# Patient Record
Sex: Male | Born: 1965
Health system: Southern US, Community
[De-identification: ages and names within clinical notes are randomized; demographics above are authoritative.]

## PROBLEM LIST (undated history)

## (undated) DIAGNOSIS — E079 Disorder of thyroid, unspecified: Secondary | ICD-10-CM

## (undated) DIAGNOSIS — M5030 Other cervical disc degeneration, unspecified cervical region: Secondary | ICD-10-CM

## (undated) DIAGNOSIS — F209 Schizophrenia, unspecified: Secondary | ICD-10-CM

## (undated) DIAGNOSIS — Z9889 Other specified postprocedural states: Secondary | ICD-10-CM

## (undated) DIAGNOSIS — E785 Hyperlipidemia, unspecified: Secondary | ICD-10-CM

## (undated) DIAGNOSIS — R918 Other nonspecific abnormal finding of lung field: Secondary | ICD-10-CM

## (undated) DIAGNOSIS — M519 Unspecified thoracic, thoracolumbar and lumbosacral intervertebral disc disorder: Secondary | ICD-10-CM

## (undated) HISTORY — DX: Unspecified thoracic, thoracolumbar and lumbosacral intervertebral disc disorder: M51.9

## (undated) HISTORY — DX: Schizophrenia, unspecified: F20.9

## (undated) HISTORY — DX: Disorder of thyroid, unspecified: E07.9

## (undated) HISTORY — DX: Other cervical disc degeneration, unspecified cervical region: M50.30

## (undated) HISTORY — PX: BACK SURGERY: SHX140

## (undated) HISTORY — DX: Other nonspecific abnormal finding of lung field: R91.8

## (undated) HISTORY — DX: Hyperlipidemia, unspecified: E78.5

---

## 1990-08-22 DIAGNOSIS — F209 Schizophrenia, unspecified: Secondary | ICD-10-CM

## 1990-08-22 HISTORY — DX: Schizophrenia, unspecified: F20.9

## 2006-05-25 ENCOUNTER — Emergency Department (HOSPITAL_COMMUNITY): Admission: EM | Admit: 2006-05-25 | Discharge: 2006-05-25 | Payer: Self-pay | Admitting: Emergency Medicine

## 2008-04-08 ENCOUNTER — Emergency Department (HOSPITAL_COMMUNITY): Admission: EM | Admit: 2008-04-08 | Discharge: 2008-04-08 | Payer: Self-pay | Admitting: Emergency Medicine

## 2008-04-23 ENCOUNTER — Emergency Department (HOSPITAL_COMMUNITY): Admission: EM | Admit: 2008-04-23 | Discharge: 2008-04-23 | Payer: Self-pay | Admitting: Emergency Medicine

## 2008-05-01 ENCOUNTER — Ambulatory Visit (HOSPITAL_COMMUNITY): Admission: RE | Admit: 2008-05-01 | Discharge: 2008-05-02 | Payer: Self-pay | Admitting: Neurosurgery

## 2008-05-23 ENCOUNTER — Encounter: Admission: RE | Admit: 2008-05-23 | Discharge: 2008-05-23 | Payer: Self-pay | Admitting: Neurosurgery

## 2009-04-19 ENCOUNTER — Encounter: Admission: RE | Admit: 2009-04-19 | Discharge: 2009-04-19 | Payer: Self-pay | Admitting: Neurosurgery

## 2009-05-05 ENCOUNTER — Encounter (INDEPENDENT_AMBULATORY_CARE_PROVIDER_SITE_OTHER): Payer: Self-pay | Admitting: Neurosurgery

## 2009-05-05 ENCOUNTER — Ambulatory Visit: Payer: Self-pay | Admitting: Surgery

## 2009-05-05 ENCOUNTER — Ambulatory Visit (HOSPITAL_COMMUNITY): Admission: RE | Admit: 2009-05-05 | Discharge: 2009-05-05 | Payer: Self-pay | Admitting: Neurosurgery

## 2009-08-29 ENCOUNTER — Emergency Department: Payer: Self-pay | Admitting: Emergency Medicine

## 2009-11-21 ENCOUNTER — Emergency Department (HOSPITAL_BASED_OUTPATIENT_CLINIC_OR_DEPARTMENT_OTHER): Admission: EM | Admit: 2009-11-21 | Discharge: 2009-11-21 | Payer: Self-pay | Admitting: Emergency Medicine

## 2009-11-25 ENCOUNTER — Ambulatory Visit: Payer: Self-pay | Admitting: Radiology

## 2009-11-25 ENCOUNTER — Ambulatory Visit (HOSPITAL_BASED_OUTPATIENT_CLINIC_OR_DEPARTMENT_OTHER): Admission: RE | Admit: 2009-11-25 | Discharge: 2009-11-25 | Payer: Self-pay | Admitting: Emergency Medicine

## 2009-12-17 ENCOUNTER — Emergency Department (HOSPITAL_BASED_OUTPATIENT_CLINIC_OR_DEPARTMENT_OTHER): Admission: EM | Admit: 2009-12-17 | Discharge: 2009-12-17 | Payer: Self-pay | Admitting: Emergency Medicine

## 2011-01-04 NOTE — Op Note (Signed)
NAMESEDERICK, Julian Jacobs              ACCOUNT NO.:  [id number]   MEDICAL RECORD NO.:  [id number]          PATIENT TYPE:  INP   LOCATION:  3023                         FACILITY:  [facility]   PHYSICIAN:  Cristi Loron, M.D.DATE OF BIRTH:  1966/06/03   DATE OF PROCEDURE:  05/01/2008  DATE OF DISCHARGE:                               OPERATIVE REPORT   BRIEF HISTORY:  The patient is a 45 year old white male who suffered  from back and left leg pain consistent with left L5 radiculopathy.  He  has failed medical management, worked up with a lumbar MRI which  demonstrated a herniated disc at L4-5 on the left.  I discussed various  treatment options with the patient including surgery.  He has weighed  the risks, benefits, and alternatives of surgery and decided to proceed  with a left L4-5 diskectomy.   PREOPERATIVE DIAGNOSES:  1. Left L4-5 herniated nucleus pulposus.  2. Spinal stenosis.  3. Lumbar radiculopathy/myelopathy.  4. Lumbago.   POSTOPERATIVE DIAGNOSES:  1. Left L4-5 herniated nucleus pulposus.  2. spinal stenosis.  3. Lumbar radiculopathy/myelopathy.  4. Lumbago.   PROCEDURE:  Left L4-5 diskectomy using microdissection.   SURGEON:  Cristi Loron, MD   ASSISTANT:  Clydene Fake, MD   ANESTHESIA:  General endotracheal.   ESTIMATED BLOOD LOSS:  25 mL.   SPECIMENS:  None.   DRAINS:  None.   COMPLICATIONS:  None.   DESCRIPTION OF PROCEDURE:  The patient was brought to the operating room  by the anesthesia team.  General endotracheal anesthesia was induced.  The patient was turned to the prone position on Wilson frame.  His  lumbosacral region was then prepared with Betadine scrub and Betadine  solution.  Sterile drapes were applied.  I then injected the area to be  incised with Marcaine with epinephrine solution.  I used a scalpel to  make a linear midline incision over the L4-5 interspace.  I used  electrocautery to perform a left-sided subperiosteal  dissection exposing  the left spinous process lamina of L4 and L5.  We obtained  intraoperative radiograph to confirm our location.  I then inserted  McCullough retractor for exposure.  We then brought the operative  microscope into the field and under its magnification and illumination  we completed the microdissection/decompression.  I used a high-speed  drill to perform a left L4 laminotomy.  I widened the laminotomy with  Kerrison punch and removed the left L4-5 ligamentum flavum.  We then  performed a foraminotomy about the left L5 nerve root.  I then used  microdissection to free up the thecal sac and nerve root from the  epidural tissue.  Dr. Phoebe Perch gently retracted the neural structures  medially with a D'Errico retractor.  This exposed a free fragment disk  herniation which had migrated caudally from the L4-5 disk space and was  compressing the L5 nerve root just as entered the neural foramen.  We  removed this disk herniation and multiple fragments using pituitary  forceps.  I then inspected the intervertebral disk.  There was no large  holes in the  annulus nor was any compression from the disk space and did  not appear to be any impending herniations.  We, therefore, did not  enter into the intervertebral disk space.  I then palpated along the  ventral surface of the thecal sac along the exit route of the left L5  nerve root and noted the neural structures were well decompressed.  We  obtained hemostasis using bipolar cautery, irrigated the wound out with  bacitracin solution, removed the retractors and then reapproximated the  patient's thoracolumbar fascia with interrupted #1 Vicryl suture,  subcutaneous tissue with interrupted 2-0 Vicryl suture, and the skin  with Steri-Strips and Benzoin.  The wound was then coated with  bacitracin ointment.  Sterile dressing was applied.  The drapes were  removed.  The patient was subsequently returned to supine position where  he was  extubated by anesthesia team and transported to the post  anesthesia care unit in stable condition.  All sponge, instrument, and  needle counts were correct at the end of the case.      Cristi Loron, M.D.  Electronically Signed     JDJ/MEDQ  D:  05/01/2008  T:  05/02/2008  Job:  [id number]

## 2011-05-25 LAB — BASIC METABOLIC PANEL
CO2: 29
GFR calc non Af Amer: >60
Sodium: 138

## 2011-05-25 LAB — CBC
MCHC: 34.8
RBC: 4.67
WBC: 12.4 — ABNORMAL HIGH

## 2015-08-23 DIAGNOSIS — R918 Other nonspecific abnormal finding of lung field: Secondary | ICD-10-CM

## 2015-08-23 HISTORY — DX: Other nonspecific abnormal finding of lung field: R91.8

## 2015-08-23 HISTORY — PX: NECK SURGERY: SHX720

## 2016-03-01 ENCOUNTER — Encounter (HOSPITAL_COMMUNITY): Payer: Self-pay | Admitting: *Deleted

## 2016-03-01 ENCOUNTER — Emergency Department (HOSPITAL_COMMUNITY)
Admission: EM | Admit: 2016-03-01 | Discharge: 2016-03-02 | Disposition: A | Discharge status: Home / Self Care | Payer: Medicare Other | Attending: Emergency Medicine | Admitting: Emergency Medicine

## 2016-03-01 DIAGNOSIS — M545 Low back pain, unspecified: Secondary | ICD-10-CM

## 2016-03-01 DIAGNOSIS — F17200 Nicotine dependence, unspecified, uncomplicated: Secondary | ICD-10-CM | POA: Insufficient documentation

## 2016-03-01 HISTORY — DX: Other specified postprocedural states: Z98.890

## 2016-03-01 MED ORDER — HYDROMORPHONE HCL 1 MG/ML IJ SOLN
1.0000 mg | Freq: Once | INTRAMUSCULAR | Status: AC
  Administered 2016-03-01: 1 mg via INTRAMUSCULAR
  Filled 2016-03-01: qty 1

## 2016-03-01 MED ORDER — ONDANSETRON 4 MG PO TBDP
8.0000 mg | ORAL_TABLET | Freq: Once | ORAL | Status: AC
  Administered 2016-03-01: 8 mg via ORAL
  Filled 2016-03-01: qty 2

## 2016-03-01 MED ORDER — OXYCODONE-ACETAMINOPHEN 5-325 MG PO TABS
ORAL_TABLET | ORAL | Status: AC
  Filled 2016-03-01: qty 1

## 2016-03-01 MED ORDER — METHOCARBAMOL 500 MG PO TABS
1000.0000 mg | ORAL_TABLET | Freq: Once | ORAL | Status: AC
  Administered 2016-03-01: 1000 mg via ORAL
  Filled 2016-03-01: qty 2

## 2016-03-01 MED ORDER — KETOROLAC TROMETHAMINE 60 MG/2ML IM SOLN
60.0000 mg | Freq: Once | INTRAMUSCULAR | Status: AC
  Administered 2016-03-01: 60 mg via INTRAMUSCULAR
  Filled 2016-03-01: qty 2

## 2016-03-01 MED ORDER — OXYCODONE-ACETAMINOPHEN 5-325 MG PO TABS
1.0000 | ORAL_TABLET | ORAL | Status: DC | PRN
  Administered 2016-03-01: 1 via ORAL

## 2016-03-01 NOTE — ED Notes (Signed)
Pt states about a hour ago pt was pulling weeds then felt a sudden pop in his lower back. Pt has hx of back issues and surgery. Dr. Lovell SheehanJenkins performed back surgery on pt in 2009. Pt denies any loose of bowel or bladder.

## 2016-03-01 NOTE — ED Provider Notes (Signed)
CSN: [id number]     Arrival date & time 03/01/16  1943 History  By signing my name below, I, Julian Jacobs, attest that this documentation has been prepared under the direction and in the presence of [name], PA-C Electronically Signed: Soijett Jacobs, ED Scribe. 03/01/2016. 11:28 PM.  Chief Complaint  Patient presents with  . Back Pain      The history is provided by the patient. No language interpreter was used.    HPI Comments: Julian Jacobs is a 50 y.o. male who presents to the Emergency Department complaining of lower back pain, right > left onset 4 hours ago. Pt states that he was doing yard work attempting to pull a root from the ground when he felt sudden a pop to his lower back. Pt reports that he had to have a partial laminectomy back surgery in 2009 by Dr. Lovell Sheehan due to a ruptured L4 and L5. Pt notes that his lower back pain radiates to his lower abdomen and into bilateral anterior thighs, right > left. He states that he is having associated symptoms of gait problem due to pain, abdominal pain, and bilateral anterior thigh pain. He states that he has not tried any medications for the relief for his symptoms. Pt denies n/v, urinary symptoms, numbness, saddle paresthesia, difficulty urinating, bowel/bladder incontinence, and any other symptoms. Denies PMHx of CA or IV drug use.    Past Medical History  Diagnosis Date  . Previous back surgery    Past Surgical History  Procedure Laterality Date  . Back surgery     History reviewed. No pertinent family history. Social History  Substance Use Topics  . Smoking status: Current Every Day Smoker  . Smokeless tobacco: Never Used  . Alcohol Use: No    Review of Systems  Gastrointestinal: Positive for abdominal pain. Negative for nausea and vomiting.       No bowel incontinence.   Genitourinary: Negative for difficulty urinating.       No bladder incontinence.   Musculoskeletal: Positive for back pain and arthralgias.   Skin: Negative for color change, rash and wound.  Neurological: Negative for numbness.      Allergies  Penicillins  Home Medications   Prior to Admission medications   Not on File   BP 137/94 mmHg  Pulse 73  Temp(Src) 97.9 F (36.6 C) (Oral)  Resp 22  SpO2 99%   Physical Exam  Constitutional: He is oriented to person, place, and time. He appears well-developed and well-nourished. He appears distressed.  Appears in pain  HENT:  Head: Normocephalic and atraumatic.  Eyes: EOM are normal.  Neck: Neck supple.  Cardiovascular: Normal rate.   Pulmonary/Chest: Effort normal. No respiratory distress.  Abdominal: Soft. He exhibits no distension and no mass. There is no tenderness. There is no rebound and no guarding.  Musculoskeletal: Normal range of motion.  Inspection: No masses, deformity, or rash. Well healed surgical scar over lower back. Palpation: Tenderness from ~L3-S1. No point tenderness. Right lumbar paraspinal muscle tenderness. ROM: Deferred Strength: 2/5 in left lower extremity and 1/5 in right lower extremity Sensation: Intact sensation with light touch in lower extremities bilaterally Gait: Antalgic Reflexes: Patellar reflex is 2+ bilaterally, Achilles is 2+ bilaterally SLR: positive bilateral seated straight leg raise  Neurological: He is alert and oriented to person, place, and time.  Skin: Skin is warm and dry.  Psychiatric: He has a normal mood and affect. His behavior is normal.  Nursing note and vitals reviewed.  ED Course  Procedures (including critical care time) DIAGNOSTIC STUDIES: Oxygen Saturation is 99% on RA, nl by my interpretation.    COORDINATION OF CARE: 11:28 PM Discussed treatment plan with pt at bedside and pt agreed to plan.   Meds given in ED:  Medications  HYDROmorphone (DILAUDID) injection 1 mg (1 mg Intramuscular Given 03/01/16 2343)  methocarbamol (ROBAXIN) tablet 1,000 mg (1,000 mg Oral Given 03/01/16 2340)  ondansetron  (ZOFRAN-ODT) disintegrating tablet 8 mg (8 mg Oral Given 03/01/16 2341)  ketorolac (TORADOL) injection 60 mg (60 mg Intramuscular Given 03/01/16 2342)    Discharge Medication List as of 03/02/2016 12:12 AM    START taking these medications   Details  cyclobenzaprine (FLEXERIL) 10 MG tablet Take 1 tablet (10 mg total) by mouth at bedtime., Starting 03/02/2016, Until Discontinued, Print    ibuprofen (ADVIL,MOTRIN) 600 MG tablet Take 1 tablet (600 mg total) by mouth 3 (three) times daily., Starting 03/02/2016, Until Discontinued, Print    ondansetron (ZOFRAN) 4 MG tablet Take 1 tablet (4 mg total) by mouth every 6 (six) hours., Starting 03/02/2016, Until Discontinued, Print    oxyCODONE-acetaminophen (PERCOCET/ROXICET) 5-325 MG tablet Take 2 tablets by mouth every 4 (four) hours as needed for severe pain., Starting 03/02/2016, Until Discontinued, Print          MDM   Final diagnoses:  Midline low back pain without sciatica   50 year old male who presents with acute on chronic back pain. It's possible he has herniated another disc or has a prolapsed disc due to his history. No red flag back pain signs/symptoms and injury was atraumatic therefore will not obtain imaging at this time. He is in a significant amount of pain. Will treat aggressively with Dilaudid, Toradol, and Robaxin. Zofran given per patient request. On recheck patient reports significant relief. Discussed he will likely need a outpatient MRI and urged him to follow up with Dr. Lovell SheehanJenkins. Patient verbalized understanding. Patient is NAD, non-toxic, with stable VS. Patient is informed of clinical course, understands medical decision making process, and agrees with plan. Opportunity for questions provided and all questions answered. Return precautions given.  I personally performed the services described in this documentation, which was scribed in my presence. The recorded information has been reviewed and is accurate.    Bethel BornKelly Marie  Megham Dwyer, PA-C 03/03/16 1425  Marily MemosJason Mesner, MD 03/04/16 [phone]

## 2016-03-02 MED ORDER — IBUPROFEN 600 MG PO TABS: 600.0000 mg | ORAL_TABLET | Freq: Three times a day (TID) | ORAL | Status: DC

## 2016-03-02 MED ORDER — ONDANSETRON HCL 4 MG PO TABS: 4.0000 mg | ORAL_TABLET | Freq: Four times a day (QID) | ORAL | Status: DC

## 2016-03-02 MED ORDER — OXYCODONE-ACETAMINOPHEN 5-325 MG PO TABS: 2.0000 | ORAL_TABLET | ORAL | Status: DC | PRN

## 2016-03-02 MED ORDER — CYCLOBENZAPRINE HCL 10 MG PO TABS: 10.0000 mg | ORAL_TABLET | Freq: Every day | ORAL | Status: DC

## 2016-03-02 NOTE — Discharge Instructions (Signed)

## 2017-03-02 DIAGNOSIS — R918 Other nonspecific abnormal finding of lung field: Secondary | ICD-10-CM | POA: Insufficient documentation

## 2017-03-02 DIAGNOSIS — K5732 Diverticulitis of large intestine without perforation or abscess without bleeding: Secondary | ICD-10-CM | POA: Insufficient documentation

## 2017-03-30 DIAGNOSIS — Z720 Tobacco use: Secondary | ICD-10-CM | POA: Insufficient documentation

## 2017-03-30 DIAGNOSIS — R6881 Early satiety: Secondary | ICD-10-CM | POA: Insufficient documentation

## 2017-03-30 DIAGNOSIS — M5412 Radiculopathy, cervical region: Secondary | ICD-10-CM | POA: Insufficient documentation

## 2017-04-17 DIAGNOSIS — R634 Abnormal weight loss: Secondary | ICD-10-CM | POA: Insufficient documentation

## 2017-05-16 DIAGNOSIS — K219 Gastro-esophageal reflux disease without esophagitis: Secondary | ICD-10-CM | POA: Insufficient documentation

## 2018-03-26 ENCOUNTER — Encounter (HOSPITAL_COMMUNITY): Payer: Self-pay | Admitting: Emergency Medicine

## 2018-03-26 ENCOUNTER — Emergency Department (HOSPITAL_COMMUNITY): Payer: Medicare HMO

## 2018-03-26 ENCOUNTER — Emergency Department (HOSPITAL_COMMUNITY)
Admission: EM | Admit: 2018-03-26 | Discharge: 2018-03-26 | Disposition: A | Discharge status: Home / Self Care | Payer: Medicare HMO | Attending: Emergency Medicine | Admitting: Emergency Medicine

## 2018-03-26 ENCOUNTER — Other Ambulatory Visit: Payer: Self-pay

## 2018-03-26 DIAGNOSIS — S199XXA Unspecified injury of neck, initial encounter: Secondary | ICD-10-CM | POA: Diagnosis not present

## 2018-03-26 DIAGNOSIS — S4991XA Unspecified injury of right shoulder and upper arm, initial encounter: Secondary | ICD-10-CM | POA: Diagnosis not present

## 2018-03-26 DIAGNOSIS — Z79899 Other long term (current) drug therapy: Secondary | ICD-10-CM | POA: Diagnosis not present

## 2018-03-26 DIAGNOSIS — M5412 Radiculopathy, cervical region: Secondary | ICD-10-CM | POA: Insufficient documentation

## 2018-03-26 DIAGNOSIS — M542 Cervicalgia: Secondary | ICD-10-CM | POA: Diagnosis not present

## 2018-03-26 DIAGNOSIS — F17210 Nicotine dependence, cigarettes, uncomplicated: Secondary | ICD-10-CM | POA: Insufficient documentation

## 2018-03-26 DIAGNOSIS — M25511 Pain in right shoulder: Secondary | ICD-10-CM | POA: Diagnosis not present

## 2018-03-26 MED ORDER — HYDROCODONE-ACETAMINOPHEN 5-325 MG PO TABS
1.0000 | ORAL_TABLET | Freq: Once | ORAL | Status: AC
  Administered 2018-03-26: 1 via ORAL
  Filled 2018-03-26: qty 1

## 2018-03-26 MED ORDER — ONDANSETRON 4 MG PO TBDP
4.0000 mg | ORAL_TABLET | Freq: Once | ORAL | Status: AC
Start: 2018-03-26 — End: 2018-03-26
  Administered 2018-03-26: 4 mg via ORAL
  Filled 2018-03-26: qty 1

## 2018-03-26 MED ORDER — ONDANSETRON HCL 4 MG PO TABS: 4.0000 mg | ORAL_TABLET | Freq: Four times a day (QID) | ORAL | 0 refills | Status: AC

## 2018-03-26 MED ORDER — ACETAMINOPHEN 325 MG PO TABS: 650.0000 mg | ORAL_TABLET | Freq: Four times a day (QID) | ORAL | 0 refills | Status: AC | PRN

## 2018-03-26 MED ORDER — HYDROCODONE-ACETAMINOPHEN 5-325 MG PO TABS: 2.0000 | ORAL_TABLET | ORAL | 0 refills | Status: AC | PRN
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
## 2018-03-26 NOTE — ED Notes (Signed)
Declined W/C at D/C and was escorted to lobby by RN. 
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
## 2018-03-26 NOTE — Discharge Instructions (Addendum)
Please return to the ED if you experience any of the following symptoms:  Your pain gets much worse and cannot be controlled with medicines. You have weakness or numbness in your hand, arm, face, or leg. You have a high fever. You have a stiff, rigid neck. You lose control of your bowels or your bladder (have incontinence). You have trouble with walking, balance, or speaking.

## 2018-04-17 ENCOUNTER — Emergency Department (HOSPITAL_COMMUNITY): Payer: Medicare HMO

## 2018-04-17 ENCOUNTER — Emergency Department (HOSPITAL_COMMUNITY)
Admission: EM | Admit: 2018-04-17 | Discharge: 2018-04-17 | Disposition: A | Discharge status: Home / Self Care | Payer: Medicare HMO | Attending: Emergency Medicine | Admitting: Emergency Medicine

## 2018-04-17 DIAGNOSIS — Z87891 Personal history of nicotine dependence: Secondary | ICD-10-CM | POA: Insufficient documentation

## 2018-04-17 DIAGNOSIS — R0789 Other chest pain: Secondary | ICD-10-CM | POA: Diagnosis present

## 2018-04-17 DIAGNOSIS — R001 Bradycardia, unspecified: Secondary | ICD-10-CM | POA: Diagnosis not present

## 2018-04-17 DIAGNOSIS — J181 Lobar pneumonia, unspecified organism: Secondary | ICD-10-CM | POA: Diagnosis not present

## 2018-04-17 DIAGNOSIS — J189 Pneumonia, unspecified organism: Secondary | ICD-10-CM

## 2018-04-17 DIAGNOSIS — Z79899 Other long term (current) drug therapy: Secondary | ICD-10-CM | POA: Diagnosis not present

## 2018-04-17 DIAGNOSIS — R079 Chest pain, unspecified: Secondary | ICD-10-CM | POA: Diagnosis not present

## 2018-04-17 LAB — CBC
HCT: 48.1 % (ref 39.0–52.0)
Hemoglobin: 16.1 g/dL (ref 13.0–17.0)
MCH: 32 pg (ref 26.0–34.0)
MCHC: 33.5 g/dL (ref 30.0–36.0)
MCV: 95.6 fL (ref 78.0–100.0)
PLATELETS: 251 10*3/uL (ref 150–400)
RBC: 5.03 MIL/uL (ref 4.22–5.81)
RDW: 12.6 % (ref 11.5–15.5)
WBC: 9.2 10*3/uL (ref 4.0–10.5)

## 2018-04-17 LAB — I-STAT TROPONIN, ED
TROPONIN I, POC: 0 ng/mL (ref 0.00–0.08)
Troponin i, poc: 0 ng/mL (ref 0.00–0.08)

## 2018-04-17 LAB — BASIC METABOLIC PANEL WITH GFR
Anion gap: 10 (ref 5–15)
BUN: 7 mg/dL (ref 6–20)
CO2: 27 mmol/L (ref 22–32)
Calcium: 9.8 mg/dL (ref 8.9–10.3)
Chloride: 103 mmol/L (ref 98–111)
Creatinine, Ser: 0.89 mg/dL (ref 0.61–1.24)
GFR calc Af Amer: >60 mL/min
GFR calc non Af Amer: >60 mL/min
Glucose, Bld: 82 mg/dL (ref 70–99)
Potassium: 4 mmol/L (ref 3.5–5.1)
Sodium: 140 mmol/L (ref 135–145)

## 2018-04-17 MED ORDER — ALBUTEROL SULFATE HFA 108 (90 BASE) MCG/ACT IN AERS: 2.0000 | INHALATION_SPRAY | Freq: Four times a day (QID) | RESPIRATORY_TRACT | 2 refills | Status: DC | PRN

## 2018-04-17 MED ORDER — LEVOFLOXACIN 500 MG PO TABS: 750.0000 mg | ORAL_TABLET | Freq: Every day | ORAL | 0 refills | Status: AC

## 2018-04-17 MED ORDER — IPRATROPIUM-ALBUTEROL 0.5-2.5 (3) MG/3ML IN SOLN
3.0000 mL | Freq: Once | RESPIRATORY_TRACT | Status: AC
  Administered 2018-04-17: 3 mL via RESPIRATORY_TRACT
  Filled 2018-04-17: qty 3

## 2018-04-17 NOTE — Discharge Instructions (Addendum)
You were seen in the ER for chest pain, cough, shortness of breath.  Chest x-ray shows pneumonia in the left lung.  Take antibiotic as prescribed.  Use albuterol inhaler every 4-6 hours over the next 2 to 3 days to help with breathing.  Return to the ER for worsening symptoms, fevers, chills, chest pain or shortness of breath on exertion, leg swelling or calf pain.  Follow up with Dr. Lovell SheehanJenkins in regards to chronic neck pain. Return to the ER for loss of sensation or heaviness to your right arm

## 2018-04-17 NOTE — ED Notes (Signed)
Patient transported to X-ray 

## 2018-04-17 NOTE — ED Notes (Signed)
ED Provider at bedside. 

## 2018-04-17 NOTE — ED Notes (Signed)
Pt ambulated hall with this RN. O2 saturation maintained at 95% or higher on room air
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
## 2018-05-22 ENCOUNTER — Other Ambulatory Visit: Payer: Self-pay | Admitting: Internal Medicine

## 2018-05-22 ENCOUNTER — Encounter: Payer: Self-pay | Admitting: Internal Medicine

## 2018-05-22 ENCOUNTER — Ambulatory Visit (INDEPENDENT_AMBULATORY_CARE_PROVIDER_SITE_OTHER): Payer: Medicare HMO | Admitting: Internal Medicine

## 2018-05-22 VITALS — BP 118/78 | HR 69 | Temp 98.1°F | Ht 67.0 in | Wt 167.2 lb

## 2018-05-22 DIAGNOSIS — R918 Other nonspecific abnormal finding of lung field: Secondary | ICD-10-CM | POA: Diagnosis not present

## 2018-05-22 DIAGNOSIS — Z79899 Other long term (current) drug therapy: Secondary | ICD-10-CM

## 2018-05-22 DIAGNOSIS — J441 Chronic obstructive pulmonary disease with (acute) exacerbation: Secondary | ICD-10-CM

## 2018-05-22 DIAGNOSIS — F209 Schizophrenia, unspecified: Secondary | ICD-10-CM | POA: Diagnosis not present

## 2018-05-22 DIAGNOSIS — R7989 Other specified abnormal findings of blood chemistry: Secondary | ICD-10-CM

## 2018-05-22 DIAGNOSIS — R062 Wheezing: Secondary | ICD-10-CM | POA: Diagnosis not present

## 2018-05-22 DIAGNOSIS — E049 Nontoxic goiter, unspecified: Secondary | ICD-10-CM

## 2018-05-22 DIAGNOSIS — J189 Pneumonia, unspecified organism: Secondary | ICD-10-CM

## 2018-05-22 MED ORDER — ALBUTEROL SULFATE HFA 108 (90 BASE) MCG/ACT IN AERS: 2.0000 | INHALATION_SPRAY | Freq: Four times a day (QID) | RESPIRATORY_TRACT | 2 refills | Status: DC | PRN

## 2018-05-22 MED ORDER — PANTOPRAZOLE SODIUM 40 MG PO TBEC: 40.0000 mg | DELAYED_RELEASE_TABLET | Freq: Every day | ORAL | 0 refills | Status: DC

## 2018-05-22 MED ORDER — GABAPENTIN 600 MG PO TABS: 600.0000 mg | ORAL_TABLET | Freq: Three times a day (TID) | ORAL | 0 refills | Status: DC
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
## 2018-05-23 DIAGNOSIS — J441 Chronic obstructive pulmonary disease with (acute) exacerbation: Secondary | ICD-10-CM | POA: Diagnosis not present

## 2018-05-23 LAB — COMPREHENSIVE METABOLIC PANEL
ALT: 14 IU/L (ref 0–44)
AST: 15 IU/L (ref 0–40)
Albumin/Globulin Ratio: 1.6 (ref 1.2–2.2)
Albumin: 4.7 g/dL (ref 3.5–5.5)
Alkaline Phosphatase: 118 IU/L — ABNORMAL HIGH (ref 39–117)
BUN/Creatinine Ratio: 8 — ABNORMAL LOW (ref 9–20)
BUN: 8 mg/dL (ref 6–24)
Bilirubin Total: 0.3 mg/dL (ref 0.0–1.2)
CALCIUM: 10 mg/dL (ref 8.7–10.2)
CO2: 24 mmol/L (ref 20–29)
CREATININE: 1.01 mg/dL (ref 0.76–1.27)
Chloride: 100 mmol/L (ref 96–106)
GFR calc Af Amer: 97 mL/min/{1.73_m2} (ref >59)
GFR, EST NON AFRICAN AMERICAN: 84 mL/min/{1.73_m2} (ref >59)
GLOBULIN, TOTAL: 2.9 g/dL (ref 1.5–4.5)
Glucose: 92 mg/dL (ref 65–99)
Potassium: 4.7 mmol/L (ref 3.5–5.2)
Sodium: 139 mmol/L (ref 134–144)
Total Protein: 7.6 g/dL (ref 6.0–8.5)

## 2018-05-23 LAB — T3: T3 TOTAL: 149 ng/dL (ref 71–180)

## 2018-05-23 LAB — T4: T4 TOTAL: 7.6 ug/dL (ref 4.5–12.0)

## 2018-05-23 LAB — TSH: TSH: 4.91 u[IU]/mL — ABNORMAL HIGH (ref 0.450–4.500)

## 2018-05-24 ENCOUNTER — Other Ambulatory Visit: Payer: Self-pay | Admitting: Internal Medicine

## 2018-05-24 ENCOUNTER — Other Ambulatory Visit: Payer: Self-pay

## 2018-05-24 ENCOUNTER — Telehealth: Payer: Self-pay | Admitting: Internal Medicine

## 2018-05-24 DIAGNOSIS — J441 Chronic obstructive pulmonary disease with (acute) exacerbation: Secondary | ICD-10-CM | POA: Diagnosis not present

## 2018-05-24 MED ORDER — ALBUTEROL SULFATE (2.5 MG/3ML) 0.083% IN NEBU: 2.5000 mg | INHALATION_SOLUTION | Freq: Four times a day (QID) | RESPIRATORY_TRACT | 12 refills | Status: DC | PRN

## 2018-05-24 MED ORDER — ALBUTEROL SULFATE (2.5 MG/3ML) 0.083% IN NEBU: 2.5000 mg | INHALATION_SOLUTION | Freq: Four times a day (QID) | RESPIRATORY_TRACT | 12 refills | Status: AC | PRN

## 2018-05-24 MED ORDER — LORATADINE 10 MG PO TABS: 10.0000 mg | ORAL_TABLET | Freq: Every day | ORAL | 2 refills | Status: AC

## 2018-05-25 ENCOUNTER — Other Ambulatory Visit: Payer: Self-pay | Admitting: Internal Medicine

## 2018-05-25 DIAGNOSIS — E039 Hypothyroidism, unspecified: Secondary | ICD-10-CM

## 2018-05-25 MED ORDER — LEVOTHYROXINE SODIUM 50 MCG PO TABS: 50.0000 ug | ORAL_TABLET | Freq: Every day | ORAL | 1 refills | Status: DC

## 2018-05-25 NOTE — Telephone Encounter (Signed)
It was called in

## 2018-05-31 ENCOUNTER — Ambulatory Visit (INDEPENDENT_AMBULATORY_CARE_PROVIDER_SITE_OTHER): Payer: Medicare HMO | Admitting: Internal Medicine

## 2018-05-31 ENCOUNTER — Encounter: Payer: Self-pay | Admitting: Internal Medicine

## 2018-05-31 VITALS — BP 112/70 | HR 70 | Temp 98.0°F | Ht 67.0 in | Wt 171.4 lb

## 2018-05-31 DIAGNOSIS — J449 Chronic obstructive pulmonary disease, unspecified: Secondary | ICD-10-CM

## 2018-05-31 DIAGNOSIS — R945 Abnormal results of liver function studies: Secondary | ICD-10-CM

## 2018-05-31 DIAGNOSIS — Z125 Encounter for screening for malignant neoplasm of prostate: Secondary | ICD-10-CM

## 2018-05-31 DIAGNOSIS — R918 Other nonspecific abnormal finding of lung field: Secondary | ICD-10-CM | POA: Insufficient documentation

## 2018-05-31 DIAGNOSIS — N139 Obstructive and reflux uropathy, unspecified: Secondary | ICD-10-CM

## 2018-05-31 DIAGNOSIS — E039 Hypothyroidism, unspecified: Secondary | ICD-10-CM | POA: Insufficient documentation

## 2018-05-31 DIAGNOSIS — F209 Schizophrenia, unspecified: Secondary | ICD-10-CM | POA: Diagnosis not present

## 2018-05-31 DIAGNOSIS — Z720 Tobacco use: Secondary | ICD-10-CM | POA: Diagnosis not present

## 2018-05-31 DIAGNOSIS — Z0000 Encounter for general adult medical examination without abnormal findings: Secondary | ICD-10-CM | POA: Diagnosis not present

## 2018-05-31 DIAGNOSIS — R5383 Other fatigue: Secondary | ICD-10-CM

## 2018-05-31 DIAGNOSIS — E559 Vitamin D deficiency, unspecified: Secondary | ICD-10-CM | POA: Diagnosis not present

## 2018-05-31 DIAGNOSIS — J439 Emphysema, unspecified: Secondary | ICD-10-CM | POA: Insufficient documentation

## 2018-05-31 DIAGNOSIS — Z1211 Encounter for screening for malignant neoplasm of colon: Secondary | ICD-10-CM

## 2018-05-31 LAB — POCT URINALYSIS DIPSTICK
Bilirubin, UA: NEGATIVE
Glucose, UA: NEGATIVE
Ketones, UA: NEGATIVE
LEUKOCYTES UA: NEGATIVE
Nitrite, UA: NEGATIVE
PH UA: 5.5 (ref 5.0–8.0)
Protein, UA: NEGATIVE
RBC UA: NEGATIVE
SPEC GRAV UA: 1.025 (ref 1.010–1.025)
UROBILINOGEN UA: 0.2 U/dL

## 2018-05-31 MED ORDER — BUDESONIDE-FORMOTEROL FUMARATE 160-4.5 MCG/ACT IN AERO: INHALATION_SPRAY | RESPIRATORY_TRACT | 2 refills | Status: AC

## 2018-05-31 NOTE — Progress Notes (Addendum)
Subjective:     Patient ID: Julian Jacobs , male    DOB: 09-20-1965 , 52 y.o.   MRN: [id number]   HPI  Here for complete physical. Has apt with mental health 11/13. Colonoscopy 2017- was told to repeat in 5 years. Thinks it was negative.  Has not heard from pulmonologist. The Duo nebs have helped him breath better, but still wheezing and getting SOB. He continues working on quit smoking.  Had a pet scant 2 years ago when the CXR showed lung nodules and was told is not suspicious.     Past Medical History:  Diagnosis Date  . Lung nodules 2017  . Previous back surgery   . Schizophrenia (HCC) 1992    Allergies  Allergen Reactions  . Penicillins Anaphylaxis    Has patient had a PCN reaction causing immediate rash, facial/tongue/throat swelling, SOB or lightheadedness with hypotension: Yes Has patient had a PCN reaction causing severe rash involving mucus membranes or skin necrosis: No Has patient had a PCN reaction that required hospitalization: No Has patient had a PCN reaction occurring within the last 10 years: No If all of the above answers are "NO", then may proceed with Cephalosporin use.     Current Outpatient Medications:  .  albuterol (PROVENTIL HFA;VENTOLIN HFA) 108 (90 Base) MCG/ACT inhaler, Inhale 2 puffs into the lungs every 6 (six) hours as needed for wheezing or shortness of breath., Disp: 1 Inhaler, Rfl: 2 .  albuterol (PROVENTIL) (2.5 MG/3ML) 0.083% nebulizer solution, Take 3 mLs (2.5 mg total) by nebulization every 6 (six) hours as needed for wheezing or shortness of breath (to use one ampule into neb machine q 4-6h prn wheezing and cough)., Disp: 75 mL, Rfl: 12 .  cyclobenzaprine (FLEXERIL) 10 MG tablet, Take 1 tablet (10 mg total) by mouth at bedtime., Disp: 10 tablet, Rfl: 0 .  gabapentin (NEURONTIN) 600 MG tablet, Take 1 tablet (600 mg total) by mouth 3 (three) times daily for 90 doses., Disp: 90 tablet, Rfl: 0 .  ibuprofen (ADVIL,MOTRIN) 600 MG tablet, Take 1  tablet (600 mg total) by mouth 3 (three) times daily., Disp: 15 tablet, Rfl: 0 .  ibuprofen (ADVIL,MOTRIN) 800 MG tablet, Take 800 mg by mouth 2 (two) times daily., Disp: , Rfl: 0 .  levofloxacin (LEVAQUIN) 500 MG tablet, Take 500 mg by mouth daily., Disp: , Rfl:  .  levothyroxine (SYNTHROID) 50 MCG tablet, Take 1 tablet (50 mcg total) by mouth daily. 30 minutes before meals, Disp: 30 tablet, Rfl: 1 .  loratadine (CLARITIN) 10 MG tablet, Take 1 tablet (10 mg total) by mouth daily., Disp: 30 tablet, Rfl: 2 .  pantoprazole (PROTONIX) 40 MG tablet, Take 1 tablet (40 mg total) by mouth daily., Disp: 30 tablet, Rfl: 0  Xanax 38m ones a week for constant thoughts.  Review of Systems  Constitutional: Positive for diaphoresis and fatigue. Negative for activity change, appetite change, chills, fever and unexpected weight change.       Night time sweats x 2 weeks. Is working on seeing a dentist since he has to get all his teeth removed  HENT: Positive for congestion, dental problem, rhinorrhea, sneezing and tinnitus. Negative for ear discharge, ear pain, facial swelling, hearing loss, mouth sores, nosebleeds, postnasal drip, sinus pressure, sinus pain, sore throat, trouble swallowing and voice change.        Has had tinnitus for many years, feels is related to his mental heath  Eyes: Positive for visual disturbance. Negative for photophobia, pain,  discharge, redness and itching.       Last eye xm 2017  Respiratory: Positive for cough, shortness of breath and wheezing. Negative for apnea and chest tightness.        L lower chest pain which is sharp and is located where the pneumonia was at  Cardiovascular: Negative for chest pain, palpitations and leg swelling.  Gastrointestinal: Positive for abdominal pain, constipation, diarrhea and nausea. Negative for abdominal distention, anal bleeding, blood in stool, rectal pain and vomiting.       Gets intermittent lower abd pain.  Gets intermittent diarrhea and  constipation with more diarrhea than constipation. Gets about 2 loose stools per day when he gets it, and not related to food that he can tell.   Endocrine: Positive for cold intolerance and polydipsia. Negative for heat intolerance, polyphagia and polyuria.  Genitourinary: Positive for difficulty urinating. Negative for decreased urine volume, discharge, dysuria, enuresis, flank pain, frequency, genital sores, hematuria, penile pain, penile swelling, scrotal swelling, testicular pain and urgency.       Unable to void standing since he had cervical neck surgery, has to sit down since the urine for urine to flow out. Never saw urology. Denies impotensy  Musculoskeletal: Positive for arthralgias, back pain, joint swelling, neck pain and neck stiffness. Negative for gait problem and myalgias.  Skin: Positive for rash. Negative for color change, pallor and wound.       Gets intermittent fine papules on arms which are itchy and legs and resolves by themselves. Resolves on its own after a couple of days. Healing lotion, hydrocortisone and antibiotic ointment has helped. Never saw derm for this.   Allergic/Immunologic: Positive for environmental allergies and food allergies.       Zots candy- rash all over  Neurological: Positive for dizziness, weakness, numbness and headaches. Negative for tremors, seizures, syncope, facial asymmetry, speech difficulty and light-headedness.       Feet go numb a lot and hands with position. Always has mild R hand weakness since R hand lacerations in the past  Hematological: Does not bruise/bleed easily.  Psychiatric/Behavioral: Positive for sleep disturbance.       Sleep problems from lungs and back pain. In his head he feels there are voices in head since very young, feels depressed and anxious, gets easily agitated but does not act out.      Today's Vitals   05/31/18 0842  BP: 112/70  Pulse: 70  Temp: 98 F (36.7 C)  TempSrc: Oral  SpO2: 96%  Weight: 171 lb 6.4  oz (77.7 kg)  Height: 5\' 7"  (1.702 m)   Body mass index is 26.85 kg/m.   Objective:  Physical Exam  BP 112/70 (BP Location: Right Arm, Patient Position: Sitting, Cuff Size: Normal)   Pulse 70   Temp 98 F (36.7 C) (Oral)   Ht 5\' 7"  (1.702 m)   Wt 171 lb 6.4 oz (77.7 kg)   SpO2 96%   BMI 26.85 kg/m   General Appearance:    Alert, cooperative, no distress, appears stated age  Head:    Normocephalic, without obvious abnormality, atraumatic  Eyes:    PERRL, conjunctiva/corneas clear, EOM's intact on R, muscle weakness noted.  Ears:    Normal TM's and external ear canals, both ears  Nose:   Nares normal, septum midline, mucosa normal, no drainage   or sinus tenderness  Throat:   Lips, mucosa, and tongue normal; has poor dentition  Neck:   Supple, symmetrical, trachea midline, no adenopathy;  thyroid:  No enlargement/tenderness/nodules; no carotid   Bruit.  Back:     Symmetric, no curvature, ROM is decreased with pain with ROM, no CVA tenderness  Lungs:     With scattered wheezing throughout, but a little better than last week  Chest wall:    No tenderness or deformity. No chest wall tenderness.  Heart:    Regular rate and rhythm, S1 and S2 normal, no murmur, rub   or gallop  Abdomen:     Soft, non-tender, bowel sounds active all four quadrants,    no masses, no organomegaly  Genitalia:    DECLINED  Rectal:    DECLINED  Extremities:   Extremities normal, atraumatic, no cyanosis or edema  Pulses:   2+ and symmetric all extremities  Skin:   Skin color, texture, turgor normal, no rashes or lesions  Lymph nodes:   Cervical, supraclavicular, and axillary nodes normal  Neurologic:   CNII-XII intact. Normal strength, sensation and reflexes      Throughout. Rhomberg neg. Normal tandem gait, and tip toe and heel gait.       Assessment And Plan:     1. Lung nodules- chronic- awaiting on pulmonology visit fur further workrup.   - Alkaline Phosphatase, Isoenzymes ordered since it  was elevated last week  2. Routine general medical examination at a health care facility- routine. Unable to do EKG today since we are out of supplies for EKG machine.   - POCT Urinalysis Dipstick (81002)- neg - Lipid Profile ordered  3. Schizophrenia, unspecified type (HCC)- chronic, will FU with mental health next month. He is functional and does not have any active hallucinations that are visible during visit.  4. Fatigue, unspecified type  - CBC no Diff - Vitamin D 1,25 Dihydroxy  5. COPD suggested by initial evaluation (HCC)- not fully improved- I will have him      try Symbicort inhaler qd. Still awaiting on pulmonology apt    6. Acquired hypothyroidism- new, has started synthroid this week  - Thyroid antibodies - Thyroid Peroxidase Antibody  7. Abnormal liver function- new  - Alkaline Phosphatase, Isoenzymes  8. Urinary obstruction when standing Sent to urology   We will call him when his results are done. Per [facility], his pulmonology referral is in the works. Fu in 6 months for thyroid labs.    Joahan Swatzell RODRIGUEZ-SOUTHWORTH, PA-C

## 2018-06-02 LAB — ALKALINE PHOSPHATASE, ISOENZYMES
ALK PHOS: 99 IU/L (ref 39–117)
BONE FRACTION: 26 % (ref 12–68)
INTESTINAL FRAC.: 7 % (ref 0–18)
LIVER FRACTION: 67 % (ref 13–88)

## 2018-06-02 LAB — CBC
HEMOGLOBIN: 14 g/dL (ref 13.0–17.7)
Hematocrit: 41.7 % (ref 37.5–51.0)
MCH: 31.6 pg (ref 26.6–33.0)
MCHC: 33.6 g/dL (ref 31.5–35.7)
MCV: 94 fL (ref 79–97)
PLATELETS: 240 10*3/uL (ref 150–450)
RBC: 4.43 x10E6/uL (ref 4.14–5.80)
RDW: 12.5 % (ref 12.3–15.4)
WBC: 11.5 10*3/uL — ABNORMAL HIGH (ref 3.4–10.8)

## 2018-06-02 LAB — LIPID PANEL
CHOL/HDL RATIO: 5.8 ratio — AB (ref 0.0–5.0)
Cholesterol, Total: 242 mg/dL — ABNORMAL HIGH (ref 100–199)
HDL: 42 mg/dL (ref >39)
LDL Calculated: 184 mg/dL — ABNORMAL HIGH (ref 0–99)
Triglycerides: 79 mg/dL (ref 0–149)
VLDL CHOLESTEROL CAL: 16 mg/dL (ref 5–40)

## 2018-06-02 LAB — THYROID ANTIBODIES
THYROGLOBULIN ANTIBODY: 834 [IU]/mL — AB (ref 0.0–0.9)
Thyroperoxidase Ab SerPl-aCnc: 152 IU/mL — ABNORMAL HIGH (ref 0–34)

## 2018-06-02 LAB — VITAMIN D 25 HYDROXY (VIT D DEFICIENCY, FRACTURES): Vit D, 25-Hydroxy: 21.4 ng/mL — ABNORMAL LOW (ref 30.0–100.0)

## 2018-06-05 ENCOUNTER — Other Ambulatory Visit: Payer: Self-pay | Admitting: Internal Medicine

## 2018-06-05 DIAGNOSIS — E559 Vitamin D deficiency, unspecified: Secondary | ICD-10-CM | POA: Insufficient documentation

## 2018-06-05 DIAGNOSIS — E063 Autoimmune thyroiditis: Secondary | ICD-10-CM | POA: Insufficient documentation

## 2018-06-05 MED ORDER — VITAMIN D (ERGOCALCIFEROL) 1.25 MG (50000 UNIT) PO CAPS: ORAL_CAPSULE | ORAL | 1 refills | Status: DC

## 2018-06-05 NOTE — Progress Notes (Signed)
Orders only

## 2018-06-05 NOTE — Addendum Note (Signed)
Addended by: Radene Knee on: 06/05/2018 08:00 AM   Modules accepted: Orders

## 2018-06-08 ENCOUNTER — Encounter: Payer: Self-pay | Admitting: Pulmonary Disease

## 2018-06-08 ENCOUNTER — Ambulatory Visit (INDEPENDENT_AMBULATORY_CARE_PROVIDER_SITE_OTHER): Payer: Medicare HMO | Admitting: Pulmonary Disease

## 2018-06-08 ENCOUNTER — Telehealth: Payer: Self-pay | Admitting: Internal Medicine

## 2018-06-08 VITALS — BP 128/78 | HR 84 | Ht 69.0 in | Wt 173.0 lb

## 2018-06-08 DIAGNOSIS — J439 Emphysema, unspecified: Secondary | ICD-10-CM | POA: Diagnosis not present

## 2018-06-08 DIAGNOSIS — Z720 Tobacco use: Secondary | ICD-10-CM

## 2018-06-08 DIAGNOSIS — R9389 Abnormal findings on diagnostic imaging of other specified body structures: Secondary | ICD-10-CM

## 2018-06-08 DIAGNOSIS — R918 Other nonspecific abnormal finding of lung field: Secondary | ICD-10-CM

## 2018-06-08 MED ORDER — TIOTROPIUM BROMIDE MONOHYDRATE 2.5 MCG/ACT IN AERS: 2.0000 | INHALATION_SPRAY | Freq: Every day | RESPIRATORY_TRACT | 3 refills | Status: DC

## 2018-06-08 MED ORDER — ALBUTEROL SULFATE HFA 108 (90 BASE) MCG/ACT IN AERS: 2.0000 | INHALATION_SPRAY | Freq: Four times a day (QID) | RESPIRATORY_TRACT | 2 refills | Status: DC | PRN

## 2018-06-08 NOTE — Progress Notes (Signed)
Synopsis: Referred in 05/2018 for multiple lung nodules and dyspnea  Subjective:   PATIENT ID: Julian Jacobs GENDER: male DOB: 1965/09/25, MRN: [id number]   HPI  Chief Complaint  Patient presents with  . Consult    multiple lung nodules and wants to have pulm eval for this.  States he 'had pneumonia 6 weeks ago - Smoker   Mr. Terril Chestnut is a 52 year old male who presents to [facility] as a new patient for evaluation of emphysema and multiple lung nodules. He has a chronic cough but in the last several months he has had worsening dyspnea, wheezing and chronic cough with occasional sputum production. The cough is worse at night and when he lays down. He has cut down on smoking which has partially improved his symptoms. On symbicort and albuterol which he states he has been more compliant in the last month. Uses albuterol twice daily and nebulizer treatments which improve his symptoms. He was recently treated for pneumonia with levaquin after he developed sharp left-sided lung pain however his left lung pain remained unchanged after treatment. He believes he had similar lung pain when he was found with lung spots in the past. Reports incidental nodules found in 2018 with a negative PET scan. He has not had a CT since then.  He tries to be active by walking daily. Reports several miles a day especially when trying to disctract himself from smoking.  Active smoker. 1.5-2 ppd x 40 years. Tried nicotine patches without success. 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  Past Medical History:  Diagnosis Date  . Disc disease, degenerative, cervical   . Hyperlipidemia   . Lumbar disc disease   . Lung nodules 2017  . Previous back surgery   . Schizophrenia (HCC) 1992     Family History  Problem Relation Age of Onset  .  Emphysema Mother   . Alcohol abuse Sister   . Kidney disease Brother      Social History   Socioeconomic History  . Marital status: Legally Separated    Spouse name: Not on file  . Number of children: Not on file  . Years of education: Not on file  . Highest education level: Not on file  Occupational History  . Not on file  Social Needs  . Financial resource strain: Not on file  . Food insecurity:    Worry: Not on file    Inability: Not on file  . Transportation needs:    Medical: Not on file    Non-medical: Not on file  Tobacco Use  . Smoking status: Current Every Day Smoker    Packs/day: 0.50    Years: 40.00    Pack years: 20.00  . Smokeless tobacco: Never Used  Substance and Sexual Activity  . Alcohol use: No  . Drug use: Yes    Types: Marijuana  . Sexual activity: Not on file  Lifestyle  . Physical activity:    Days per week: Not on file    Minutes per session: Not on file  . Stress: Not on file  Relationships  . Social connections:    Talks on phone: Not on file    Gets together: Not on file    Attends religious service: Not on file    Active member of club or organization: Not on file    Attends meetings of clubs or organizations:  Not on file    Relationship status: Not on file  . Intimate partner violence:    Fear of current or ex partner: Not on file    Emotionally abused: Not on file    Physically abused: Not on file    Forced sexual activity: Not on file  Other Topics Concern  . Not on file  Social History Narrative  . Not on file     Allergies  Allergen Reactions  . Penicillins Anaphylaxis    Has patient had a PCN reaction causing immediate rash, facial/tongue/throat swelling, SOB or lightheadedness with hypotension: Yes Has patient had a PCN reaction causing severe rash involving mucus membranes or skin necrosis: No Has patient had a PCN reaction that required hospitalization: No Has patient had a PCN reaction occurring within the last 10  years: No If all of the above answers are "NO", then may proceed with Cephalosporin use.      Outpatient Medications Prior to Visit  Medication Sig Dispense Refill  . albuterol (PROVENTIL) (2.5 MG/3ML) 0.083% nebulizer solution Take 3 mLs (2.5 mg total) by nebulization every 6 (six) hours as needed for wheezing or shortness of breath (to use one ampule into neb machine q 4-6h prn wheezing and cough). 75 mL 12  . alprazolam (XANAX) 2 MG tablet Take 2 mg by mouth 3 (three) times daily as needed for sleep (only takes as needed and not every day).    . budesonide-formoterol (SYMBICORT) 160-4.5 MCG/ACT inhaler 2 puffs bid 1 Inhaler 2  . cyclobenzaprine (FLEXERIL) 10 MG tablet Take 1 tablet (10 mg total) by mouth at bedtime. 10 tablet 0  . gabapentin (NEURONTIN) 600 MG tablet Take 1 tablet (600 mg total) by mouth 3 (three) times daily for 90 doses. 90 tablet 0  . ibuprofen (ADVIL,MOTRIN) 800 MG tablet Take 800 mg by mouth 2 (two) times daily.  0  . levothyroxine (SYNTHROID) 50 MCG tablet Take 1 tablet (50 mcg total) by mouth daily. 30 minutes before meals 30 tablet 1  . loratadine (CLARITIN) 10 MG tablet Take 1 tablet (10 mg total) by mouth daily. 30 tablet 2  . pantoprazole (PROTONIX) 40 MG tablet Take 1 tablet (40 mg total) by mouth daily. 30 tablet 0  . albuterol (PROVENTIL HFA;VENTOLIN HFA) 108 (90 Base) MCG/ACT inhaler Inhale 2 puffs into the lungs every 6 (six) hours as needed for wheezing or shortness of breath. 1 Inhaler 2  . ibuprofen (ADVIL,MOTRIN) 600 MG tablet Take 1 tablet (600 mg total) by mouth 3 (three) times daily. 15 tablet 0  . levofloxacin (LEVAQUIN) 500 MG tablet Take 500 mg by mouth daily.    . Vitamin D, Ergocalciferol, (DRISDOL) 50000 units CAPS capsule Take one twice a week with a fatty meal (Patient not taking: Reported on 06/08/2018) 8 capsule 1   No facility-administered medications prior to visit.     Review of Systems  Constitutional: Negative for chills, diaphoresis,  fever, malaise/fatigue and weight loss.  HENT: Negative for congestion and sore throat.   Respiratory: Positive for cough, sputum production, shortness of breath and wheezing. Negative for hemoptysis.   Cardiovascular: Positive for chest pain (left sided chest pain, TTP). Negative for orthopnea, leg swelling and PND.  Gastrointestinal: Negative for abdominal pain, heartburn and nausea.  Genitourinary: Negative for frequency.  Musculoskeletal: Negative for myalgias.  Skin: Negative for rash.  Neurological: Negative for dizziness, weakness and headaches.  Endo/Heme/Allergies: Does not bruise/bleed easily.  Psychiatric/Behavioral: Positive for hallucinations (auditory, known hx schizophrenia).  Objective:  Physical Exam  Constitutional: He is oriented to person, place, and time. He appears well-developed and well-nourished. No distress.  HENT:  Head: Normocephalic and atraumatic.  Nose: Nose normal.  Mouth/Throat: Oropharynx is clear and moist. No oropharyngeal exudate.  Eyes: Conjunctivae and EOM are normal. No scleral icterus.  Neck: Normal range of motion. Neck supple. No JVD present. No tracheal deviation present.  Cardiovascular: Normal rate, regular rhythm, normal heart sounds and intact distal pulses. Exam reveals no gallop and no friction rub.  No murmur heard. Pulmonary/Chest: Effort normal. No respiratory distress. He has no wheezes. He has no rales.  Abdominal: Soft. Bowel sounds are normal. He exhibits no distension. There is no tenderness.  Musculoskeletal: Normal range of motion. He exhibits no edema.  Lymphadenopathy:    He has no cervical adenopathy.  Neurological: He is alert and oriented to person, place, and time. No cranial nerve deficit.  Skin: Skin is warm and dry. No rash noted. He is not diaphoretic. No erythema.  Psychiatric: He has a normal mood and affect. His behavior is normal. Thought content normal.  Vitals reviewed.    Vitals:   06/08/18 1334    BP: 128/78  Pulse: 84  SpO2: 98%  Weight: 173 lb (78.5 kg)  Height: 5\' 9"  (1.753 m)    CBC    Component Value Date/Time   WBC 11.5 (H) 05/31/2018 0941   WBC 9.2 04/17/2018 1300   RBC 4.43 05/31/2018 0941   RBC 5.03 04/17/2018 1300   HGB 14.0 05/31/2018 0941   HCT 41.7 05/31/2018 0941   PLT 240 05/31/2018 0941   MCV 94 05/31/2018 0941   MCH 31.6 05/31/2018 0941   MCH 32.0 04/17/2018 1300   MCHC 33.6 05/31/2018 0941   MCHC 33.5 04/17/2018 1300   RDW 12.5 05/31/2018 0941     Chest imaging:  PET CT 03/03/17  Impression: 1.Subcentimeter pulmonary nodules are below size resolution for PET imaging. Recommend follow up chest CT in 6 months.  2.No abnormal metabolic activity.   CT C/A/P 02/20/17 IMPRESSION:  1. Mild distal descending/sigmoid colitis/diverticulitis. 2. 2.0 x 1.5 cm right upper lobe spiculated opacity, finding is suspicious for malignancy versus scarring. Consider PET/CT for further evaluation. 3. Indeterminate 4 mm right middle lobe and 5 mm right lower lobe pulmonary nodules. Metastatic disease cannot be excluded. 4. Extensive centrilobular and paraseptal emphysema. 5. Indeterminate 9 mm right hepatic lobe lesion.  PFT: None on file  I have personally reviewed the above labs, images and tests noted above.    Assessment & Plan:   Abnormal chest xray - Plan: Pulmonary function test  Multiple pulmonary nodules - Plan: CT Chest W Contrast, Pulmonary function test  Tobacco abuse  Discussion: 51 year old male with history of schizophrenia and tobacco use who presents to Pulmonary Clinic as a new patient for the management of multiple pulmonary nodules and emphysema. Reports incidental nodules found in 2018 with work-up negative PET. Also clinical presentation of uncontrolled chronic bronchitis/emphysema, not in active exacerbation.   Right upper lung nodule 2 x 1.5 cm RML and RLL subcentimeter nodule CT Chest with contrast ordered Will obtained prior  PET CT and CT CAP from 02/2017   Shortness of breath Emphysema We will order breathing test (full pulmonary function test) before you next visit in 3 months. START Spiriva Respimat 2.31mcg 2 puffs once a day CONTINUE Symbicort 160-4.5 mcg 2 puffs twice a day FOR YOUR RESCUE INHALER, use albuterol every 3-4 hours as needed for  shortness of breath or wheezing.  Tobacco abuse Patient is an active smoker. We discussed smoking cessation for 5 minutes. Provided patient with information regarding risks, cessation techniques, and interventions. Patient is interested in quitting. Due to hx schizophrenia, patient would not be an ideal candidate for available smoking cessation medications. Will defer to patient's mental health provider for which he is schedule to see on 07/09/18.  Vaccinations: Patient declined due to recent pneumonia  If you experience worsening shortness of breath or wheezing that does not improve, please seek medical attention immediately.  Thank you for choosing [facility] for your health needs!   Astoria Condon Mechele Collin, MD [facility] 06/08/2018 2:33 PM  Personal pager: [phone] If unanswered, please page CCM On-call: [phone]    Current Outpatient Medications:  .  albuterol (PROVENTIL HFA;VENTOLIN HFA) 108 (90 Base) MCG/ACT inhaler, Inhale 2 puffs into the lungs every 6 (six) hours as needed for wheezing or shortness of breath., Disp: 1 Inhaler, Rfl: 2 .  albuterol (PROVENTIL) (2.5 MG/3ML) 0.083% nebulizer solution, Take 3 mLs (2.5 mg total) by nebulization every 6 (six) hours as needed for wheezing or shortness of breath (to use one ampule into neb machine q 4-6h prn wheezing and cough)., Disp: 75 mL, Rfl: 12 .  alprazolam (XANAX) 2 MG tablet, Take 2 mg by mouth 3 (three) times daily as needed for sleep (only takes as needed and not every day)., Disp: , Rfl:  .  budesonide-formoterol (SYMBICORT) 160-4.5 MCG/ACT inhaler, 2 puffs bid, Disp: 1  Inhaler, Rfl: 2 .  cyclobenzaprine (FLEXERIL) 10 MG tablet, Take 1 tablet (10 mg total) by mouth at bedtime., Disp: 10 tablet, Rfl: 0 .  gabapentin (NEURONTIN) 600 MG tablet, Take 1 tablet (600 mg total) by mouth 3 (three) times daily for 90 doses., Disp: 90 tablet, Rfl: 0 .  ibuprofen (ADVIL,MOTRIN) 800 MG tablet, Take 800 mg by mouth 2 (two) times daily., Disp: , Rfl: 0 .  levothyroxine (SYNTHROID) 50 MCG tablet, Take 1 tablet (50 mcg total) by mouth daily. 30 minutes before meals, Disp: 30 tablet, Rfl: 1 .  loratadine (CLARITIN) 10 MG tablet, Take 1 tablet (10 mg total) by mouth daily., Disp: 30 tablet, Rfl: 2 .  pantoprazole (PROTONIX) 40 MG tablet, Take 1 tablet (40 mg total) by mouth daily., Disp: 30 tablet, Rfl: 0 .  Tiotropium Bromide Monohydrate (SPIRIVA RESPIMAT) 2.5 MCG/ACT AERS, Inhale 2 puffs into the lungs daily., Disp: 1 Inhaler, Rfl: 3 .  Vitamin D, Ergocalciferol, (DRISDOL) 50000 units CAPS capsule, Take one twice a week with a fatty meal (Patient not taking: Reported on 06/08/2018), Disp: 8 capsule, Rfl: 1

## 2018-06-08 NOTE — Patient Instructions (Signed)
Right upper lung nodule 2 x 1.5 cm RML and RLL subcentimeter nodule CT Chest with contrast ordered  Shortness of breath Emphysema We will order breathing test (full pulmonary function test) before you next visit in 3 months. START Spiriva Respimat 2.73mcg 2 puffs once a day CONTINUE Symbicort 160-4.5 mcg 2 puffs twice a day FOR YOUR RESCUE INHALER, use albuterol every 3-4 hours as needed for shortness of breath or wheezing.  Tobacco abuse Patient is an active smoker. We discussed smoking cessation for 5 minutes. Provided patient with information regarding risks, cessation techniques, and interventions.  If you experience worsening shortness of breath or wheezing that does not improve, please seek medical attention immediately.  Thank you for choosing [facility] for your health needs!

## 2018-06-11 ENCOUNTER — Inpatient Hospital Stay: Admission: RE | Admit: 2018-06-11 | Payer: Self-pay | Source: Ambulatory Visit

## 2018-06-13 ENCOUNTER — Ambulatory Visit (INDEPENDENT_AMBULATORY_CARE_PROVIDER_SITE_OTHER)
Admission: RE | Admit: 2018-06-13 | Discharge: 2018-06-13 | Disposition: A | Discharge status: Home / Self Care | Payer: Medicare HMO | Source: Ambulatory Visit | Attending: Pulmonary Disease | Admitting: Pulmonary Disease

## 2018-06-13 DIAGNOSIS — R918 Other nonspecific abnormal finding of lung field: Secondary | ICD-10-CM

## 2018-06-13 MED ORDER — IOPAMIDOL (ISOVUE-300) INJECTION 61%
80.0000 mL | Freq: Once | INTRAVENOUS | Status: AC | PRN
  Administered 2018-06-13: 80 mL via INTRAVENOUS

## 2018-06-18 DIAGNOSIS — J441 Chronic obstructive pulmonary disease with (acute) exacerbation: Secondary | ICD-10-CM | POA: Diagnosis not present

## 2018-06-20 ENCOUNTER — Other Ambulatory Visit: Payer: Self-pay | Admitting: Internal Medicine

## 2018-06-22 ENCOUNTER — Telehealth: Payer: Self-pay | Admitting: Pulmonary Disease

## 2018-06-22 NOTE — Telephone Encounter (Signed)
Please contact patient regarding CT Chest on 06/13/18. I have reviewed his imaging and note multiple pulmonary nodules. The radiologist noted his prior right upper lung nodule is compatible with pleural-parenchymal scarring. No further testing is warranted at this time. Please remind him of follow-up appt in January when I plan to order a repeat CT to follow his lung nodules.

## 2018-06-22 NOTE — Telephone Encounter (Signed)
Spoke with pt and notified of results per Dr. Everardo All. Pt verbalized understanding and denied any questions. I reminded him about appt and he states will be sure and keep this  Nothing further needed

## 2018-06-24 DIAGNOSIS — J441 Chronic obstructive pulmonary disease with (acute) exacerbation: Secondary | ICD-10-CM | POA: Diagnosis not present

## 2018-06-29 DIAGNOSIS — Z6825 Body mass index (BMI) 25.0-25.9, adult: Secondary | ICD-10-CM | POA: Diagnosis not present

## 2018-06-29 DIAGNOSIS — M542 Cervicalgia: Secondary | ICD-10-CM | POA: Diagnosis not present

## 2018-06-29 DIAGNOSIS — R030 Elevated blood-pressure reading, without diagnosis of hypertension: Secondary | ICD-10-CM | POA: Diagnosis not present

## 2018-06-29 DIAGNOSIS — M4722 Other spondylosis with radiculopathy, cervical region: Secondary | ICD-10-CM | POA: Diagnosis not present

## 2018-07-04 ENCOUNTER — Ambulatory Visit (INDEPENDENT_AMBULATORY_CARE_PROVIDER_SITE_OTHER): Payer: Medicare HMO | Admitting: Psychiatry

## 2018-07-04 ENCOUNTER — Encounter (HOSPITAL_COMMUNITY): Payer: Self-pay | Admitting: Psychiatry

## 2018-07-04 VITALS — BP 120/78 | Ht 69.5 in | Wt 169.0 lb

## 2018-07-04 DIAGNOSIS — F1490 Cocaine use, unspecified, uncomplicated: Secondary | ICD-10-CM

## 2018-07-04 DIAGNOSIS — F4312 Post-traumatic stress disorder, chronic: Secondary | ICD-10-CM

## 2018-07-04 DIAGNOSIS — F200 Paranoid schizophrenia: Secondary | ICD-10-CM

## 2018-07-04 MED ORDER — ASENAPINE MALEATE 5 MG SL SUBL: 5.0000 mg | SUBLINGUAL_TABLET | Freq: Every day | SUBLINGUAL | 0 refills | Status: AC
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
## 2018-07-10 ENCOUNTER — Other Ambulatory Visit: Payer: Self-pay | Admitting: Neurosurgery

## 2018-07-10 DIAGNOSIS — M542 Cervicalgia: Secondary | ICD-10-CM

## 2018-07-10 DIAGNOSIS — M4722 Other spondylosis with radiculopathy, cervical region: Secondary | ICD-10-CM

## 2018-07-11 ENCOUNTER — Ambulatory Visit (HOSPITAL_COMMUNITY): Payer: Medicaid Other | Admitting: Licensed Clinical Social Worker

## 2018-07-12 ENCOUNTER — Ambulatory Visit (INDEPENDENT_AMBULATORY_CARE_PROVIDER_SITE_OTHER): Payer: Medicare HMO

## 2018-07-12 ENCOUNTER — Ambulatory Visit (INDEPENDENT_AMBULATORY_CARE_PROVIDER_SITE_OTHER): Payer: Medicare HMO | Admitting: Internal Medicine

## 2018-07-12 ENCOUNTER — Ambulatory Visit: Payer: Medicare HMO | Admitting: Internal Medicine

## 2018-07-12 VITALS — BP 90/60 | HR 65 | Temp 98.4°F | Ht 68.5 in | Wt 171.0 lb

## 2018-07-12 DIAGNOSIS — J42 Unspecified chronic bronchitis: Secondary | ICD-10-CM | POA: Diagnosis not present

## 2018-07-12 DIAGNOSIS — I951 Orthostatic hypotension: Secondary | ICD-10-CM

## 2018-07-12 DIAGNOSIS — K219 Gastro-esophageal reflux disease without esophagitis: Secondary | ICD-10-CM

## 2018-07-12 DIAGNOSIS — M542 Cervicalgia: Secondary | ICD-10-CM

## 2018-07-12 DIAGNOSIS — M62838 Other muscle spasm: Secondary | ICD-10-CM | POA: Diagnosis not present

## 2018-07-12 DIAGNOSIS — Z0000 Encounter for general adult medical examination without abnormal findings: Secondary | ICD-10-CM

## 2018-07-12 DIAGNOSIS — J441 Chronic obstructive pulmonary disease with (acute) exacerbation: Secondary | ICD-10-CM | POA: Diagnosis not present

## 2018-07-12 DIAGNOSIS — E559 Vitamin D deficiency, unspecified: Secondary | ICD-10-CM

## 2018-07-12 DIAGNOSIS — E063 Autoimmune thyroiditis: Secondary | ICD-10-CM | POA: Diagnosis not present

## 2018-07-12 MED ORDER — CYCLOBENZAPRINE HCL 10 MG PO TABS: 10.0000 mg | ORAL_TABLET | Freq: Three times a day (TID) | ORAL | 0 refills | Status: DC | PRN

## 2018-07-12 MED ORDER — PANTOPRAZOLE SODIUM 40 MG PO TBEC: 40.0000 mg | DELAYED_RELEASE_TABLET | Freq: Every day | ORAL | 0 refills | Status: DC

## 2018-07-12 NOTE — Progress Notes (Signed)
Subjective:     Patient ID: Julian Jacobs , male    DOB: November 15, 1965 , 52 y.o.   MRN: [id number]   CC- Fu thyroid  HPI 1-Pt is here for FU hypothyroid, taking the thyroid on an empty stomach causes abdominal pain and cant eat due to this. Has been putting his alarm.   2- Woke up this am around 4 am, has been cough a lot more this am. "Not feeling well and his R lung hurts" His cough is minimally productive with clear mucous. No fever or chills. Has been having L thorax pain where he normally has hurt in the past. He admits he has not been drinking much fluids.   3- Has seen mental health provider and was placed a new med, but his insurance does not cover it. Is awaiting on their call.  4-Has seen neurosurgeon and he has ordered C spine MRI. The surgeon suspects the fusion did not take.   5- He ran out of his Flexeril, and would like a refill. This helps his neck muscle spasms. He still had some when he saw the neurologist.   6- his insurance did not cover vit D, so he bought the 5,000 IU wants to know how much to take.   Past Medical History:  Diagnosis Date  . Disc disease, degenerative, cervical   . Hyperlipidemia   . Lumbar disc disease   . Lung nodules 2017  . Previous back surgery   . Schizophrenia (HCC) 1992  . Thyroid disease      Family History  Problem Relation Age of Onset  . Emphysema Mother   . Alcohol abuse Sister   . Kidney disease Brother     Current Outpatient Medications:  .  albuterol (PROVENTIL HFA;VENTOLIN HFA) 108 (90 Base) MCG/ACT inhaler, Inhale 2 puffs into the lungs every 6 (six) hours as needed for wheezing or shortness of breath., Disp: 1 Inhaler, Rfl: 2 .  albuterol (PROVENTIL) (2.5 MG/3ML) 0.083% nebulizer solution, Take 3 mLs (2.5 mg total) by nebulization every 6 (six) hours as needed for wheezing or shortness of breath (to use one ampule into neb machine q 4-6h prn wheezing and cough)., Disp: 75 mL, Rfl: 12 .  asenapine (SAPHRIS) 5 MG SUBL 24 hr  tablet, Place 1 tablet (5 mg total) under the tongue at bedtime. (Patient not taking: Reported on 07/12/2018), Disp: 30 tablet, Rfl: 0 .  budesonide-formoterol (SYMBICORT) 160-4.5 MCG/ACT inhaler, 2 puffs bid, Disp: 1 Inhaler, Rfl: 2 .  cyclobenzaprine (FLEXERIL) 10 MG tablet, Take 1 tablet (10 mg total) by mouth at bedtime. (Patient not taking: Reported on 07/04/2018), Disp: 10 tablet, Rfl: 0 .  gabapentin (NEURONTIN) 600 MG tablet, Take 1 tablet (600 mg total) by mouth 3 (three) times daily for 90 doses., Disp: 90 tablet, Rfl: 0 .  levothyroxine (SYNTHROID) 50 MCG tablet, Take 1 tablet (50 mcg total) by mouth daily. 30 minutes before meals, Disp: 30 tablet, Rfl: 1 .  loratadine (CLARITIN) 10 MG tablet, Take 1 tablet (10 mg total) by mouth daily., Disp: 30 tablet, Rfl: 2 .  pantoprazole (PROTONIX) 40 MG tablet, TAKE 1 TABLET BY MOUTH ONCE DAILY, Disp: 30 tablet, Rfl: 0 .  Tiotropium Bromide Monohydrate (SPIRIVA RESPIMAT) 2.5 MCG/ACT AERS, Inhale 2 puffs into the lungs daily., Disp: 1 Inhaler, Rfl: 3   Allergies  Allergen Reactions  . Penicillins Anaphylaxis    Has patient had a PCN reaction causing immediate rash, facial/tongue/throat swelling, SOB or lightheadedness with hypotension: Yes Has patient  had a PCN reaction causing severe rash involving mucus membranes or skin necrosis: No Has patient had a PCN reaction that required hospitalization: No Has patient had a PCN reaction occurring within the last 10 years: No If all of the above answers are "NO", then may proceed with Cephalosporin use.      Review of Systems  Constitutional: Positive for diaphoresis and fatigue. Negative for chills and fever.  HENT: Negative for congestion, postnasal drip, rhinorrhea and sneezing.   Respiratory: Positive for cough and shortness of breath. Negative for wheezing.   Cardiovascular: Negative for chest pain, palpitations and leg swelling.  Gastrointestinal: Positive for abdominal pain. Negative for  nausea.       After taking thyroid med  Musculoskeletal: Positive for neck stiffness.  Skin: Negative for rash.     Today's Vitals   07/12/18 0904  BP: 90/60  Pulse: 65  Temp: 98.4 F (36.9 C)  TempSrc: Oral  SpO2: 95%  Weight: 171 lb (77.6 kg)  Height: 5' 8.5" (1.74 m)   Body mass index is 25.62 kg/m.   Objective:  Physical Exam  Constitutional: He is oriented to person, place, and time. He appears well-developed and well-nourished. No distress.  Eyes: Conjunctivae are normal.  Neck: Neck supple.  Cardiovascular: Normal rate and regular rhythm.  No murmur heard. Lymphadenopathy:    He has no cervical adenopathy.  Neurological: He is alert and oriented to person, place, and time.  Skin: He is not diaphoretic.    Assessment And Plan:     1. Hashimoto's thyroiditis- new.  - Ambulatory referral to Endocrinology - TSH - T3, free - T4, Free He will try the synthroid at bed time on empty stomach and see if this helps the nausea and abd pain.  2. Muscle spasms of neck- chronic, will continue Flexeril til MRI findings and plan with neurologist.  - cyclobenzaprine (FLEXERIL) 10 MG tablet; Take 1 tablet (10 mg total) by mouth 3 (three) times daily as needed for muscle spasms.  Dispense: 30 tablet; Refill: 0  3. Gastroesophageal reflux disease without esophagitis- chronic, stable on current med - pantoprazole (PROTONIX) 40 MG tablet; Take 1 tablet (40 mg total) by mouth daily.  Dispense: 90 tablet; Refill: 0  4. Vitamin D deficiency- acute. Advised to take 50,000 IU twice a week.  5. Cervicalgia- chronic. I refilled the flexeril and he will continue his care with neurosurgeon.   6. Chronic bronchitis, unspecified chronic bronchitis type (HCC)- improved with new inhalers. Pending PFT testing ordered by his pulmonologist.  7. Orthostatic hypotension- mild, needs to push more fluids and drink water.  We will call him with lab results. FU in 3 months, we will repeat thyroid  test then if today's are abnormal.    Jquan Egelston RODRIGUEZ-SOUTHWORTH, PA-C

## 2018-07-12 NOTE — Progress Notes (Signed)
Subjective:   Julian Jacobs is a 52 y.o. male who presents for an Initial Medicare Annual Wellness Visit.  Review of Systems  n/a Cardiac Risk Factors include: male gender;smoking/ tobacco exposure    Objective:    Today's Vitals   07/12/18 0848  BP: 90/60  Pulse: 65  Temp: 98.4 F (36.9 C)  TempSrc: Oral  SpO2: 95%  Weight: 171 lb (77.6 kg)  Height: 5' 8.5" (1.74 m)  PainSc: 0-No pain   Body mass index is 25.62 kg/m.  Advanced Directives 07/12/2018 03/01/2016  Does Patient Have a Medical Advance Directive? No No  Would patient like information on creating a medical advance directive? No - Patient declined No - patient declined information    Current Medications (verified) Outpatient Encounter Medications as of 07/12/2018  Medication Sig  . albuterol (PROVENTIL HFA;VENTOLIN HFA) 108 (90 Base) MCG/ACT inhaler Inhale 2 puffs into the lungs every 6 (six) hours as needed for wheezing or shortness of breath.  Marland Kitchen. albuterol (PROVENTIL) (2.5 MG/3ML) 0.083% nebulizer solution Take 3 mLs (2.5 mg total) by nebulization every 6 (six) hours as needed for wheezing or shortness of breath (to use one ampule into neb machine q 4-6h prn wheezing and cough).  . budesonide-formoterol (SYMBICORT) 160-4.5 MCG/ACT inhaler 2 puffs bid  . loratadine (CLARITIN) 10 MG tablet Take 1 tablet (10 mg total) by mouth daily.  . pantoprazole (PROTONIX) 40 MG tablet TAKE 1 TABLET BY MOUTH ONCE DAILY  . Tiotropium Bromide Monohydrate (SPIRIVA RESPIMAT) 2.5 MCG/ACT AERS Inhale 2 puffs into the lungs daily.  Marland Kitchen. asenapine (SAPHRIS) 5 MG SUBL 24 hr tablet Place 1 tablet (5 mg total) under the tongue at bedtime. (Patient not taking: Reported on 07/12/2018)  . cyclobenzaprine (FLEXERIL) 10 MG tablet Take 1 tablet (10 mg total) by mouth at bedtime. (Patient not taking: Reported on 07/04/2018)  . gabapentin (NEURONTIN) 600 MG tablet Take 1 tablet (600 mg total) by mouth 3 (three) times daily for 90 doses.  Marland Kitchen.  levothyroxine (SYNTHROID) 50 MCG tablet Take 1 tablet (50 mcg total) by mouth daily. 30 minutes before meals   No facility-administered encounter medications on file as of 07/12/2018.     Allergies (verified) Penicillins   History: Past Medical History:  Diagnosis Date  . Disc disease, degenerative, cervical   . Hyperlipidemia   . Lumbar disc disease   . Lung nodules 2017  . Previous back surgery   . Schizophrenia (HCC) 1992  . Thyroid disease    Past Surgical History:  Procedure Laterality Date  . BACK SURGERY    . NECK SURGERY  2017   Family History  Problem Relation Age of Onset  . Emphysema Mother   . Alcohol abuse Sister   . Kidney disease Brother    Social History   Socioeconomic History  . Marital status: Legally Separated    Spouse name: Not on file  . Number of children: Not on file  . Years of education: Not on file  . Highest education level: Not on file  Occupational History  . Occupation: PART TIME  Social Needs  . Financial resource strain: Not hard at all  . Food insecurity:    Worry: Never true    Inability: Never true  . Transportation needs:    Medical: No    Non-medical: No  Tobacco Use  . Smoking status: Current Every Day Smoker    Packs/day: 0.75    Years: 40.00    Pack years: 30.00  . Smokeless tobacco:  Never Used  Substance and Sexual Activity  . Alcohol use: No  . Drug use: Yes    Types: Marijuana  . Sexual activity: Not Currently  Lifestyle  . Physical activity:    Days per week: 0 days    Minutes per session: 0 min  . Stress: Very much  Relationships  . Social connections:    Talks on phone: Not on file    Gets together: Not on file    Attends religious service: Not on file    Active member of club or organization: Not on file    Attends meetings of clubs or organizations: Not on file    Relationship status: Not on file  Other Topics Concern  . Not on file  Social History Narrative  . Not on file   Tobacco  Counseling Ready to quit: Yes Counseling given: Yes   Clinical Intake:  Pre-visit preparation completed: Yes  Pain : No/denies pain Pain Score: 0-No pain     Nutritional Status: BMI 25 -29 Overweight Nutritional Risks: None Diabetes: No  How often do you need to have someone help you when you read instructions, pamphlets, or other written materials from your doctor or pharmacy?: 1 - Never What is the last grade level you completed in school?: ged  Interpreter Needed?: No  Information entered by :: naLLEN lpn  Activities of Daily Living In your present state of health, do you have any difficulty performing the following activities: 07/12/2018  Hearing? Y  Comment constant ringing in ears  Vision? Y  Comment glasses needed for distance  Difficulty concentrating or making decisions? Y  Comment states part of his disability  Walking or climbing stairs? Y  Comment back and neck surgery  Dressing or bathing? N  Doing errands, shopping? N  Preparing Food and eating ? N  Using the Toilet? N  In the past six months, have you accidently leaked urine? N  Do you have problems with loss of bowel control? N  Managing your Medications? N  Managing your Finances? N  Housekeeping or managing your Housekeeping? N  Some recent data might be hidden     Immunizations and Health Maintenance  There is no immunization history on file for this patient. Health Maintenance Due  Topic Date Due  . Hepatitis C Screening  [birth year]  . HIV Screening  02/28/1979  . COLONOSCOPY  02/27/2014    Patient Care Team: Dorothyann Peng, MD as PCP - General (Internal Medicine)  Indicate any recent Medical Services you may have received from other than [facility] providers in the past year (date may be approximate).    Assessment:   This is a routine wellness examination for [facility].  Hearing/Vision screen Vision Screening Comments: Bi- annual eye exams. No regular eye exams  Dietary issues and  exercise activities discussed: Current Exercise Habits: The patient has a physically strenous job, but has no regular exercise apart from work., Exercise limited by: None identified  Goals    . Quit Smoking (pt-stated)      Depression Screen PHQ 2/9 Scores 07/12/2018 05/31/2018  PHQ - 2 Score 0 0    Fall Risk Fall Risk  07/12/2018 05/31/2018  Falls in the past year? 0 No    Is the patient's home free of loose throw rugs in walkways, pet beds, electrical cords, etc?   yes      Grab bars in the bathroom? No      Handrails on the stairs?   n/a  Adequate lighting?   yes  Timed Get Up and Go performed: n/a  Cognitive Function:     6CIT Screen 07/12/2018  What Year? 0 points  What month? 0 points  What time? 0 points  Count back from 20 0 points  Months in reverse 0 points  Repeat phrase 10 points  Total Score 10    Screening Tests Health Maintenance  Topic Date Due  . Hepatitis C Screening  09[birth year]  . HIV Screening  02/28/1979  . COLONOSCOPY  02/27/2014  . INFLUENZA VACCINE  07/13/2019 (Originally 03/22/2018)  . TETANUS/TDAP  07/13/2019 (Originally 02/28/1983)    Qualifies for Shingles Vaccine?  yes  Cancer Screenings: Lung: Low Dose CT Chest recommended if Age 44-80 years, 30 pack-year currently smoking OR have quit w/in 15years. Patient does qualify. Colorectal: 2017 per patient  Additional Screenings:  Hepatitis C Screening: due      Plan:    Patient declines vaccinations. States he is recovering from pneumonia and was told to wait. Wants to work on quitting smoking. States has tried.  I have personally reviewed and noted the following in the patient's chart:   . Medical and social history . Use of alcohol, tobacco or illicit drugs  . Current medications and supplements . Functional ability and status . Nutritional status . Physical activity . Advanced directives . List of other physicians . Hospitalizations, surgeries, and ER visits in previous  12 months . Vitals . Screenings to include cognitive, depression, and falls . Referrals and appointments  In addition, I have reviewed and discussed with patient certain preventive protocols, quality metrics, and best practice recommendations. A written personalized care plan for preventive services as well as general preventive health recommendations were provided to patient.     Barb Merino, LPN   [id number]

## 2018-07-12 NOTE — Patient Instructions (Signed)
Julian Jacobs , Thank you for taking time to come for your Medicare Wellness Visit. I appreciate your ongoing commitment to your health goals. Please review the following plan we discussed and let me know if I can assist you in the future.   Screening recommendations/referrals: Colonoscopy: 2017 per patient Recommended yearly ophthalmology/optometry visit for glaucoma screening and checkup Recommended yearly dental visit for hygiene and checkup  Vaccinations: Influenza vaccine: decline Pneumococcal vaccine: decline Tdap vaccine: decline Shingles vaccine: decline    Advanced directives: Advance directive discussed with you today. Even though you declined this today please call our office should you change your mind and we can give you the proper paperwork for you to fill out.   Conditions/risks identified: Smoking: states he does want to quit, but it is not easy  Next appointment: 07/12/2018  Preventive Care 40-64 Years, Male Preventive care refers to lifestyle choices and visits with your health care provider that can promote health and wellness. What does preventive care include?  A yearly physical exam. This is also called an annual well check.  Dental exams once or twice a year.  Routine eye exams. Ask your health care provider how often you should have your eyes checked.  Personal lifestyle choices, including:  Daily care of your teeth and gums.  Regular physical activity.  Eating a healthy diet.  Avoiding tobacco and drug use.  Limiting alcohol use.  Practicing safe sex.  Taking low-dose aspirin every day starting at age 54. What happens during an annual well check? The services and screenings done by your health care provider during your annual well check will depend on your age, overall health, lifestyle risk factors, and family history of disease. Counseling  Your health care provider may ask you questions about your:  Alcohol use.  Tobacco use.  Drug  use.  Emotional well-being.  Home and relationship well-being.  Sexual activity.  Eating habits.  Work and work Astronome[employer]vironment. Screening  You may have the following tests or measurements:  Height, weight, and BMI.  Blood pressure.  Lipid and cholesterol levels. These may be checked every 5 years, or more frequently if you are over 249 years old.  Skin check.  Lung cancer screening. You may have this screening every year starting at age 54 if you have a 30-pack-year history of smoking and currently smoke or have quit within the past 15 years.  Fecal occult blood test (FOBT) of the stool. You may have this test every year starting at age 54.  Flexible sigmoidoscopy or colonoscopy. You may have a sigmoidoscopy every 5 years or a colonoscopy every 10 years starting at age 54.  Prostate cancer screening. Recommendations will vary depending on your family history and other risks.  Hepatitis C blood test.  Hepatitis B blood test.  Sexually transmitted disease (STD) testing.  Diabetes screening. This is done by checking your blood sugar (glucose) after you have not eaten for a while (fasting). You may have this done every 1-3 years. Discuss your test results, treatment options, and if necessary, the need for more tests with your health care provider. Vaccines  Your health care provider may recommend certain vaccines, such as:  Influenza vaccine. This is recommended every year.  Tetanus, diphtheria, and acellular pertussis (Tdap, Td) vaccine. You may need a Td booster every 10 years.  Zoster vaccine. You may need this after age 54.  Pneumococcal 13-valent conjugate (PCV13) vaccine. You may need this if you have certain conditions and have not been vaccinated.  Pneumococcal polysaccharide (PPSV23) vaccine. You may need one or two doses if you smoke cigarettes or if you have certain conditions. Talk to your health care provider about which screenings and vaccines you need and how  often you need them. This information is not intended to replace advice given to you by your health care provider. Make sure you discuss any questions you have with your health care provider. Document Released: 09/04/2015 Document Revised: 04/27/2016 Document Reviewed: 06/09/2015 Elsevier Interactive Patient Education  2017 [employer] Prevention in the Home Falls can cause injuries. They can happen to people of all ages. There are many things you can do to make your home safe and to help prevent falls. What can I do on the outside of my home?  Regularly fix the edges of walkways and driveways and fix any cracks.  Remove anything that might make you trip as you walk through a door, such as a raised step or threshold.  Trim any bushes or trees on the path to your home.  Use bright outdoor lighting.  Clear any walking paths of anything that might make someone trip, such as rocks or tools.  Regularly check to see if handrails are loose or broken. Make sure that both sides of any steps have handrails.  Any raised decks and porches should have guardrails on the edges.  Have any leaves, snow, or ice cleared regularly.  Use sand or salt on walking paths during winter.  Clean up any spills in your garage right away. This includes oil or grease spills. What can I do in the bathroom?  Use night lights.  Install grab bars by the toilet and in the tub and shower. Do not use towel bars as grab bars.  Use non-skid mats or decals in the tub or shower.  If you need to sit down in the shower, use a plastic, non-slip stool.  Keep the floor dry. Clean up any water that spills on the floor as soon as it happens.  Remove soap buildup in the tub or shower regularly.  Attach bath mats securely with double-sided non-slip rug tape.  Do not have throw rugs and other things on the floor that can make you trip. What can I do in the bedroom?  Use night lights.  Make sure that you have a  light by your bed that is easy to reach.  Do not use any sheets or blankets that are too big for your bed. They should not hang down onto the floor.  Have a firm chair that has side arms. You can use this for support while you get dressed.  Do not have throw rugs and other things on the floor that can make you trip. What can I do in the kitchen?  Clean up any spills right away.  Avoid walking on wet floors.  Keep items that you use a lot in easy-to-reach places.  If you need to reach something above you, use a strong step stool that has a grab bar.  Keep electrical cords out of the way.  Do not use floor polish or wax that makes floors slippery. If you must use wax, use non-skid floor wax.  Do not have throw rugs and other things on the floor that can make you trip. What can I do with my stairs?  Do not leave any items on the stairs.  Make sure that there are handrails on both sides of the stairs and use them. Fix handrails that  are broken or loose. Make sure that handrails are as long as the stairways.  Check any carpeting to make sure that it is firmly attached to the stairs. Fix any carpet that is loose or worn.  Avoid having throw rugs at the top or bottom of the stairs. If you do have throw rugs, attach them to the floor with carpet tape.  Make sure that you have a light switch at the top of the stairs and the bottom of the stairs. If you do not have them, ask someone to add them for you. What else can I do to help prevent falls?  Wear shoes that:  Do not have high heels.  Have rubber bottoms.  Are comfortable and fit you well.  Are closed at the toe. Do not wear sandals.  If you use a stepladder:  Make sure that it is fully opened. Do not climb a closed stepladder.  Make sure that both sides of the stepladder are locked into place.  Ask someone to hold it for you, if possible.  Clearly mark and make sure that you can see:  Any grab bars or  handrails.  First and last steps.  Where the edge of each step is.  Use tools that help you move around (mobility aids) if they are needed. These include:  Canes.  Walkers.  Scooters.  Crutches.  Turn on the lights when you go into a dark area. Replace any light bulbs as soon as they burn out.  Set up your furniture so you have a clear path. Avoid moving your furniture around.  If any of your floors are uneven, fix them.  If there are any pets around you, be aware of where they are.  Review your medicines with your doctor. Some medicines can make you feel dizzy. This can increase your chance of falling. Ask your doctor what other things that you can do to help prevent falls. This information is not intended to replace advice given to you by your health care provider. Make sure you discuss any questions you have with your health care provider. Document Released: 06/04/2009 Document Revised: 01/14/2016 Document Reviewed: 09/12/2014 Elsevier Interactive Patient Education  2017 [employer].

## 2018-07-12 NOTE — Patient Instructions (Addendum)
Try to get off inflammatory foods like dairy, wheat or gluten, sweets, caffeine and alcohol.   Switch the thyroid medication to night time, but do not take the Protonix( stomach medication) at night time, take it in the am 30 min before meals.   Try to get Vit B12 dissolvable under the tongue and take 1000 mcg a day, since Protonix can cause this vitamin to be depleted. Antithyroglobulin Antibody Test Why am I having this test? This test is used as a marker for autoimmune thyroid diseases and other related diseases. It does this by testing for antibodies that have been formed against the thyroid by your own body. What kind of sample is taken? A blood sample is required for this test. It is usually collected by inserting a needle into a vein. How do I prepare for this test? There is no preparation required for this test. What are the reference ranges? Reference ranges are considered healthy ranges established after testing a large group of healthy people. Reference ranges may vary among different people, labs, and hospitals. It is your responsibility to obtain your test results. Ask the lab or department performing the test when and how you will get your results. Reference ranges are as follows:  Less than 116 international units/mL.  What do the results mean? Increased levels of antibodies may indicate:  Hashimoto thyroiditis.  Rheumatoid arthritis.  Hypothyroidism.  Thyroid cancer.  Talk with your health care provider to discuss your results, treatment options, and if necessary, the need for more tests. Talk with your health care provider if you have any questions about your results. Talk with your health care provider to discuss your results, treatment options, and if necessary, the need for more tests. Talk with your health care provider if you have any questions about your results. This information is not intended to replace advice given to you by your health care provider. Make sure  you discuss any questions you have with your health care provider. Document Released: 09/01/2004 Document Revised: 04/11/2016 Document Reviewed: 01/29/2014 Elsevier Interactive Patient Education  2018 [employer]. Antithyroid Peroxidase Antibody Test Why am I having this test? This test is used for diagnosing different thyroid diseases. Your health care provider may perform this test along with other thyroid antibody tests to aid in specific diagnoses. What kind of sample is taken? A blood sample is required for this test. It is usually collected by inserting a needle into a vein. How do I prepare for this test? There is no preparation required for this test. What are the reference ranges? Reference rangesare considered healthy rangesestablished after testing a large group of healthy people. Reference rangesmay vary among different people, labs, and hospitals. It is your responsibility to obtain your test results. Ask the lab or department performing the test when and how you will get your results. Reference ranges are as follows:  Less than 9 international units/mL for all ages.  What do the results mean? Increased levels of antithyroid peroxidase antibody may indicate:  Hashimoto thyroiditis.  Rheumatoid arthritis (RA).  Hypothyroidism.  Thyroid cancer.  Talk with your health care provider to discuss your results, treatment options, and if necessary, the need for more tests. Talk with your health care provider if you have any questions about your results. Talk with your health care provider to discuss your results, treatment options, and if necessary, the need for more tests. Talk with your health care provider if you have any questions about your results. This information is not  intended to replace advice given to you by your health care provider. Make sure you discuss any questions you have with your health care provider. Document Released: 09/01/2004 Document Revised:  04/11/2016 Document Reviewed: 01/29/2014 Elsevier Interactive Patient Education  [employer]2018 [employer] Inc[employer]

## 2018-07-13 ENCOUNTER — Ambulatory Visit: Payer: Self-pay | Admitting: Endocrinology

## 2018-07-13 LAB — T3, FREE: T3, Free: 3 pg/mL (ref 2.0–4.4)

## 2018-07-13 LAB — T4, FREE: Free T4: 1.35 ng/dL (ref 0.82–1.77)

## 2018-07-13 LAB — TSH: TSH: 1.72 u[IU]/mL (ref 0.450–4.500)

## 2018-07-17 ENCOUNTER — Ambulatory Visit (HOSPITAL_COMMUNITY): Payer: Medicaid Other | Admitting: Licensed Clinical Social Worker

## 2018-07-17 ENCOUNTER — Encounter: Payer: Self-pay | Admitting: Internal Medicine

## 2018-07-17 ENCOUNTER — Other Ambulatory Visit: Payer: Self-pay | Admitting: Internal Medicine

## 2018-07-17 DIAGNOSIS — E039 Hypothyroidism, unspecified: Secondary | ICD-10-CM

## 2018-07-17 MED ORDER — LEVOTHYROXINE SODIUM 50 MCG PO TABS: 50.0000 ug | ORAL_TABLET | Freq: Every day | ORAL | 0 refills | Status: DC

## 2018-07-24 DIAGNOSIS — J441 Chronic obstructive pulmonary disease with (acute) exacerbation: Secondary | ICD-10-CM | POA: Diagnosis not present

## 2018-07-26 ENCOUNTER — Ambulatory Visit: Payer: Self-pay | Admitting: Endocrinology

## 2018-07-26 ENCOUNTER — Ambulatory Visit (HOSPITAL_COMMUNITY): Payer: Medicaid Other | Admitting: Licensed Clinical Social Worker

## 2018-07-27 ENCOUNTER — Telehealth (HOSPITAL_COMMUNITY): Payer: Self-pay

## 2018-07-27 ENCOUNTER — Other Ambulatory Visit (HOSPITAL_COMMUNITY): Payer: Self-pay

## 2018-07-27 ENCOUNTER — Ambulatory Visit
Admission: RE | Admit: 2018-07-27 | Discharge: 2018-07-27 | Disposition: A | Discharge status: Home / Self Care | Payer: Medicare HMO | Source: Ambulatory Visit | Attending: Neurosurgery | Admitting: Neurosurgery

## 2018-07-27 DIAGNOSIS — M4722 Other spondylosis with radiculopathy, cervical region: Secondary | ICD-10-CM

## 2018-07-27 DIAGNOSIS — M50221 Other cervical disc displacement at C4-C5 level: Secondary | ICD-10-CM | POA: Diagnosis not present

## 2018-07-27 DIAGNOSIS — F200 Paranoid schizophrenia: Secondary | ICD-10-CM

## 2018-07-27 DIAGNOSIS — M542 Cervicalgia: Secondary | ICD-10-CM

## 2018-07-27 DIAGNOSIS — M47812 Spondylosis without myelopathy or radiculopathy, cervical region: Secondary | ICD-10-CM | POA: Diagnosis not present

## 2018-07-27 DIAGNOSIS — M5030 Other cervical disc degeneration, unspecified cervical region: Secondary | ICD-10-CM | POA: Diagnosis not present

## 2018-07-27 DIAGNOSIS — F4312 Post-traumatic stress disorder, chronic: Secondary | ICD-10-CM

## 2018-07-27 DIAGNOSIS — M4802 Spinal stenosis, cervical region: Secondary | ICD-10-CM | POA: Diagnosis not present

## 2018-07-27 NOTE — Telephone Encounter (Signed)
I received a prior auth for patients S[employer]. This was approved and I called the patient back to let him know. Patient was prescribed the medication last month, but had not been taking it as it costed over 400 dollars. I had the pharmacy send the PA form, patient can get for $3.80 now. I called patient back and he said he will pick it up on Monday.

## 2018-08-08 DIAGNOSIS — J441 Chronic obstructive pulmonary disease with (acute) exacerbation: Secondary | ICD-10-CM | POA: Diagnosis not present

## 2018-08-24 ENCOUNTER — Ambulatory Visit (HOSPITAL_COMMUNITY): Payer: Medicaid Other | Admitting: Psychiatry

## 2018-08-24 DIAGNOSIS — J441 Chronic obstructive pulmonary disease with (acute) exacerbation: Secondary | ICD-10-CM | POA: Diagnosis not present

## 2018-08-31 DIAGNOSIS — H5213 Myopia, bilateral: Secondary | ICD-10-CM | POA: Diagnosis not present

## 2018-09-06 ENCOUNTER — Encounter: Payer: Self-pay | Admitting: Pulmonary Disease

## 2018-09-06 ENCOUNTER — Ambulatory Visit (INDEPENDENT_AMBULATORY_CARE_PROVIDER_SITE_OTHER): Payer: Medicare HMO | Admitting: Pulmonary Disease

## 2018-09-06 VITALS — BP 124/68 | HR 68 | Temp 97.5°F | Ht 69.0 in | Wt 165.6 lb

## 2018-09-06 DIAGNOSIS — R918 Other nonspecific abnormal finding of lung field: Secondary | ICD-10-CM

## 2018-09-06 DIAGNOSIS — R9389 Abnormal findings on diagnostic imaging of other specified body structures: Secondary | ICD-10-CM | POA: Diagnosis not present

## 2018-09-06 DIAGNOSIS — J449 Chronic obstructive pulmonary disease, unspecified: Secondary | ICD-10-CM

## 2018-09-06 DIAGNOSIS — J439 Emphysema, unspecified: Secondary | ICD-10-CM | POA: Diagnosis not present

## 2018-09-06 LAB — PULMONARY FUNCTION TEST
DL/VA % pred: 72 %
DL/VA: 3.31 ml/min/mmHg/L
DLCO unc % pred: 96 %
DLCO unc: 29.78 ml/min/mmHg
FEF 25-75 POST: 1.8 L/s
FEF 25-75 PRE: 1.98 L/s
FEF2575-%Change-Post: -9 %
FEF2575-%PRED-POST: 56 %
FEF2575-%Pred-Pre: 62 %
FEV1-%Change-Post: -13 %
FEV1-%PRED-PRE: 90 %
FEV1-%Pred-Post: 78 %
FEV1-POST: 2.88 L
FEV1-Pre: 3.35 L
FEV1FVC-%Change-Post: -15 %
FEV1FVC-%PRED-PRE: 84 %
FEV6-%CHANGE-POST: 2 %
FEV6-%PRED-POST: 114 %
FEV6-%Pred-Pre: 111 %
FEV6-POST: 5.24 L
FEV6-Pre: 5.13 L
FEV6FVC-%CHANGE-POST: 0 %
FEV6FVC-%Pred-Post: 103 %
FEV6FVC-%Pred-Pre: 103 %
FVC-%Change-Post: 1 %
FVC-%PRED-POST: 110 %
FVC-%Pred-Pre: 108 %
FVC-Post: 5.27 L
FVC-Pre: 5.17 L
POST FEV1/FVC RATIO: 55 %
Post FEV6/FVC ratio: 100 %
Pre FEV1/FVC ratio: 65 %
Pre FEV6/FVC Ratio: 99 %

## 2018-09-06 MED ORDER — IPRATROPIUM BROMIDE 0.02 % IN SOLN: 0.5000 mg | Freq: Three times a day (TID) | RESPIRATORY_TRACT | 3 refills | Status: AC

## 2018-09-06 NOTE — Progress Notes (Signed)
PFT completed today.  

## 2018-09-06 NOTE — Patient Instructions (Addendum)
Emphysema --START Atrovent nebulizer three times daily. Patient already has home nebulizer. --CONTINUE Symbicort 160-4.215mcg 2 puffs twice daily --FOR YOUR RESCUE INHALER, use albuterol every 3-4 hours as needed for shortness of breath or wheezing.FOR YOUR RESCUE INHALER, use albuterol every 3-4 hours as needed for shortness of breath or wheezing.  Pulmonary Nodules --Order CT Chest with Contrast in April 2020. Will request 1mm image cut.  STOP SMOKING!!!  Follow-up in 3 months   Musculoskeletal Pain Musculoskeletal pain refers to aches and pains in your bones, joints, muscles, and the tissues that surround them. This pain can occur in any part of the body. It can last for a short time (acute) or a long time (chronic). A physical exam, lab tests, and imaging studies may be done to find the cause of your musculoskeletal pain. Follow these instructions at home:  Lifestyle  Try to control or lower your stress levels. Stress increases muscle tension and can worsen musculoskeletal pain. It is important to recognize when you are anxious or stressed and learn ways to manage it. This may include: ? Meditation or yoga. ? Cognitive or behavioral therapy. ? Acupuncture or massage therapy.  You may continue all activities unless the activities cause more pain. When the pain gets better, slowly resume your normal activities. Gradually increase the intensity and duration of your activities or exercise. Managing pain, stiffness, and swelling  Take over-the-counter and prescription medicines only as told by your health care provider.  When your pain is severe, bed rest may be helpful. Lie or sit in any position that is comfortable, but get out of bed and walk around at least every couple of hours.  If directed, apply heat to the affected area as often as told by your health care provider. Use the heat source that your health care provider recommends, such as a moist heat pack or a heating  pad. ? Place a towel between your skin and the heat source. ? Leave the heat on for 20-30 minutes. ? Remove the heat if your skin turns bright red. This is especially important if you are unable to feel pain, heat, or cold. You may have a greater risk of getting burned.  If directed, put ice on the painful area. ? Put ice in a plastic bag. ? Place a towel between your skin and the bag. ? Leave the ice on for 20 minutes, 2-3 times a day. General instructions  Your health care provider may recommend that you see a physical therapist. This person can help you come up with a safe exercise program. Do any exercises as told by your physical therapist.  Keep all follow-up visits, including any physical therapy visits, as told by your health care providers. This is important. Contact a health care provider if:  Your pain gets worse.  Medicines do not help ease your pain.  You cannot use the part of your body that hurts, such as your arm, leg, or neck.  You have trouble sleeping.  You have trouble doing your normal activities. Get help right away if:  You have a new injury and your pain is worse or different.  You feel numb or you have tingling in the painful area. Summary  Musculoskeletal pain refers to aches and pains in your bones, joints, muscles, and the tissues that surround them.  This pain can occur in any part of the body.  Your health care provider may recommend that you see a physical therapist. This person can help you come  up with a safe exercise program. Do any exercises as told by your physical therapist.  Lower your stress level. Stress can worsen musculoskeletal pain. Ways to lower stress may include meditation, yoga, cognitive or behavioral therapy, acupuncture, and massage therapy. This information is not intended to replace advice given to you by your health care provider. Make sure you discuss any questions you have with your health care provider. Document  Released: 08/08/2005 Document Revised: 09/07/2016 Document Reviewed: 09/07/2016 Elsevier Interactive Patient Education  2019 [employer].

## 2018-09-06 NOTE — Progress Notes (Addendum)
Synopsis: Referred in 05/2018 for   Subjective:   PATIENT ID: Julian Jacobs GENDER: male DOB: 09/09/1965, MRN: [id number]   HPI  Chief Complaint  Patient presents with  . Follow-up    PFT today - right lung pain and non-prod cough started 2 days ago    Julian Jacobs this year is a 53 year old male with emphysema and pulmonary nodules who presents for follow-up.  Since our last visit on 06/08/2018, I reviewed his CT scan from 06/13/2018 which demonstrated pulmonary nodules less than 6 mm in size. He has had four colds including the flu.  He continues to have unchanged dyspnea, wheezing and chronic cough with occasional sputum production.  He has not been able to tolerate the Spiriva due to the headaches. Has been compliant with his Symbicort. He was previously using his nebulizer only once a day but now twice a day. His nebulizer allows him to breath better. Reports he is smoking over 1ppd which he attributes to being at home all day due to the cold weather. Still having bilateral "lung" pain that is aggravated by turning. Symptoms improve when he leaves the house where there is suspected mold and pet dander. Reports sinus congestion for  which he takes allergy pill.   Social History: 80-pack-year history.  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  Past Medical History:  Diagnosis Date  . Disc disease, degenerative, cervical   . Hyperlipidemia   . Lumbar disc disease   . Lung nodules 2017  . Previous back surgery   . Schizophrenia (HCC) 1992  . Thyroid disease      Family History  Problem Relation Age of Onset  . Emphysema Mother   . Alcohol abuse Sister   . Kidney disease Brother      Social History   Occupational History  . Occupation: PART TIME  Tobacco Use  . Smoking status: Current Every  Day Smoker    Packs/day: 0.75    Years: 40.00    Pack years: 30.00  . Smokeless tobacco: Never Used  Substance and Sexual Activity  . Alcohol use: No  . Drug use: Yes    Types: Marijuana  . Sexual activity: Not Currently    Allergies  Allergen Reactions  . Penicillins Anaphylaxis    Has patient had a PCN reaction causing immediate rash, facial/tongue/throat swelling, SOB or lightheadedness with hypotension: Yes Has patient had a PCN reaction causing severe rash involving mucus membranes or skin necrosis: No Has patient had a PCN reaction that required hospitalization: No Has patient had a PCN reaction occurring within the last 10 years: No If all of the above answers are "NO", then may proceed with Cephalosporin use.      Outpatient Medications Prior to Visit  Medication Sig Dispense Refill  . albuterol (PROVENTIL HFA;VENTOLIN HFA) 108 (90 Base) MCG/ACT inhaler Inhale 2 puffs into the lungs every 6 (six) hours as needed for wheezing or shortness of breath. 1 Inhaler 2  . albuterol (PROVENTIL) (2.5 MG/3ML) 0.083% nebulizer solution Take 3 mLs (2.5 mg total) by nebulization every 6 (six) hours as needed for wheezing or shortness of breath (to use one ampule into neb machine q 4-6h prn wheezing and cough). 75 mL 12  . asenapine (SAPHRIS) 5 MG SUBL 24 hr tablet Place 1 tablet (5 mg total) under the tongue at bedtime. 30 tablet 0  . budesonide-formoterol (SYMBICORT)  160-4.5 MCG/ACT inhaler 2 puffs bid 1 Inhaler 2  . cyclobenzaprine (FLEXERIL) 10 MG tablet Take 1 tablet (10 mg total) by mouth 3 (three) times daily as needed for muscle spasms. 30 tablet 0  . loratadine (CLARITIN) 10 MG tablet Take 1 tablet (10 mg total) by mouth daily. 30 tablet 2  . pantoprazole (PROTONIX) 40 MG tablet Take 1 tablet (40 mg total) by mouth daily. 90 tablet 0  . cyclobenzaprine (FLEXERIL) 10 MG tablet Take 1 tablet (10 mg total) by mouth at bedtime. 10 tablet 0  . gabapentin (NEURONTIN) 600 MG tablet Take 1  tablet (600 mg total) by mouth 3 (three) times daily for 90 doses. 90 tablet 0  . levothyroxine (SYNTHROID) 50 MCG tablet Take 1 tablet (50 mcg total) by mouth daily. 30 minutes before meals 90 tablet 0  . Tiotropium Bromide Monohydrate (SPIRIVA RESPIMAT) 2.5 MCG/ACT AERS Inhale 2 puffs into the lungs daily. (Patient not taking: Reported on 09/06/2018) 1 Inhaler 3   No facility-administered medications prior to visit.     Review of Systems  Constitutional: Negative for chills, diaphoresis, fever, malaise/fatigue and weight loss.  HENT: Positive for congestion. Negative for sore throat.   Respiratory: Positive for cough, sputum production, shortness of breath and wheezing. Negative for hemoptysis.   Cardiovascular: Positive for chest pain (bilateral side chest pain) and leg swelling. Negative for orthopnea and PND.  Gastrointestinal: Positive for heartburn. Negative for abdominal pain and nausea.  Genitourinary: Negative for frequency.  Musculoskeletal: Positive for myalgias.  Skin: Negative for rash.  Neurological: Positive for headaches. Negative for dizziness and weakness.  Endo/Heme/Allergies: Does not bruise/bleed easily.     Objective:   Vitals:   09/06/18 1031  BP: 124/68  Pulse: 68  Temp: (!) 97.5 F (36.4 C)  SpO2: 100%  Weight: 165 lb 9.6 oz (75.1 kg)  Height: 5\' 9"  (1.753 m)   SpO2: 100 % O2 Device: None (Room air)  Physical Exam: General: Well-appearing, no acute distress HENT: [place], AT, OP clear, MMM Eyes: EOMI, no scleral icterus Respiratory: Clear to auscultation bilaterally.  No crackles, wheezing or rales Cardiovascular: RRR, -M/R/G, no JVD GI: BS+, soft, nontender Extremities:-Edema,-tenderness Neuro: AAO x4, CNII-XII grossly intact Skin: Intact, no rashes or bruising Psych: Anxious mood, pressed affect  Chest imaging: CT chest 06/13/2018-bilateral scattered solid pulmonary nodules, largest 6 mm.  Borderline, mild right hilar adenopathy.   Emphysema  PFT: 09/06/18 - Moderate obstructive defect with air trapping. Normal gas exchange. FEV (2.88L) 78%  Imaging, labs and test noted above have been reviewed independently by me.    Assessment & Plan:   COPD/Emphysema Pulmonary nodules  Discussion:  COPD, Group D Classification --START Atrovent nebulizer three times daily. Patient already has home nebulizer. --CONTINUE Symbicort 160-4.635mcg 2 puffs twice daily  Pulmonary Nodules --Order CT Chest with Contrast in April 2020. Will request 1mm image cut.  Orders Placed This Encounter  Procedures  . CT Chest W Contrast    1 mm image cuts    Standing Status:   Future    Standing Expiration Date:   11/05/2019    Scheduling Instructions:     Schedule for April 2020    Order Specific Question:   ** REASON FOR EXAM (FREE TEXT)    Answer:   pulmonary nodules    Order Specific Question:   If indicated for the ordered procedure, I authorize the administration of contrast media per Radiology protocol    Answer:   Yes    Order  Specific Question:   Preferred imaging location?    Answer:   [place] [facility] - [facility]    Order Specific Question:   Radiology Contrast Protocol - do NOT remove file path    Answer:   [email or web address]   Meds ordered this encounter  Medications  . ipratropium (ATROVENT) 0.02 % nebulizer solution    Sig: Take 2.5 mLs (0.5 mg total) by nebulization 3 (three) times daily.    Dispense:  75 mL    Refill:  3    Return in about 3 months (around 12/06/2018).  Federick Levene Mechele Collin, MD [facility] 09/08/2018 3:41 PM  Personal pager: [phone] If unanswered, please page CCM On-call: [phone]

## 2018-09-14 ENCOUNTER — Telehealth: Payer: Self-pay | Admitting: Neurology

## 2018-09-14 ENCOUNTER — Ambulatory Visit: Payer: Medicare HMO | Admitting: Neurology

## 2018-09-14 NOTE — Telephone Encounter (Signed)
This patient did not show for a new patient appointment today. 

## 2018-09-17 ENCOUNTER — Encounter: Payer: Self-pay | Admitting: Neurology

## 2018-09-24 DIAGNOSIS — J441 Chronic obstructive pulmonary disease with (acute) exacerbation: Secondary | ICD-10-CM | POA: Diagnosis not present

## 2018-10-09 ENCOUNTER — Encounter: Payer: Self-pay | Admitting: Internal Medicine

## 2018-10-09 ENCOUNTER — Other Ambulatory Visit: Payer: Self-pay

## 2018-10-09 ENCOUNTER — Ambulatory Visit (INDEPENDENT_AMBULATORY_CARE_PROVIDER_SITE_OTHER): Payer: Medicare HMO | Admitting: Internal Medicine

## 2018-10-09 VITALS — BP 132/72 | HR 76 | Temp 97.7°F | Ht 68.4 in | Wt 163.0 lb

## 2018-10-09 DIAGNOSIS — M62838 Other muscle spasm: Secondary | ICD-10-CM

## 2018-10-09 DIAGNOSIS — F17200 Nicotine dependence, unspecified, uncomplicated: Secondary | ICD-10-CM

## 2018-10-09 DIAGNOSIS — R202 Paresthesia of skin: Secondary | ICD-10-CM | POA: Diagnosis not present

## 2018-10-09 DIAGNOSIS — H52209 Unspecified astigmatism, unspecified eye: Secondary | ICD-10-CM | POA: Diagnosis not present

## 2018-10-09 DIAGNOSIS — J439 Emphysema, unspecified: Secondary | ICD-10-CM

## 2018-10-09 DIAGNOSIS — E039 Hypothyroidism, unspecified: Secondary | ICD-10-CM

## 2018-10-09 DIAGNOSIS — F2089 Other schizophrenia: Secondary | ICD-10-CM

## 2018-10-09 DIAGNOSIS — K219 Gastro-esophageal reflux disease without esophagitis: Secondary | ICD-10-CM

## 2018-10-09 DIAGNOSIS — E7800 Pure hypercholesterolemia, unspecified: Secondary | ICD-10-CM

## 2018-10-09 DIAGNOSIS — H5213 Myopia, bilateral: Secondary | ICD-10-CM | POA: Diagnosis not present

## 2018-10-09 MED ORDER — VARENICLINE TARTRATE 0.5 MG X 11 & 1 MG X 42 PO MISC: ORAL | 0 refills | Status: AC

## 2018-10-09 MED ORDER — GABAPENTIN 600 MG PO TABS: 600.0000 mg | ORAL_TABLET | Freq: Three times a day (TID) | ORAL | 2 refills | Status: DC

## 2018-10-09 MED ORDER — LEVOTHYROXINE SODIUM 50 MCG PO TABS: 50.0000 ug | ORAL_TABLET | Freq: Every day | ORAL | 0 refills | Status: DC

## 2018-10-09 MED ORDER — TIOTROPIUM BROMIDE MONOHYDRATE 2.5 MCG/ACT IN AERS: 2.0000 | INHALATION_SPRAY | Freq: Every day | RESPIRATORY_TRACT | 3 refills | Status: AC

## 2018-10-09 MED ORDER — CYCLOBENZAPRINE HCL 10 MG PO TABS: 10.0000 mg | ORAL_TABLET | Freq: Three times a day (TID) | ORAL | 0 refills | Status: DC | PRN

## 2018-10-09 MED ORDER — ALBUTEROL SULFATE HFA 108 (90 BASE) MCG/ACT IN AERS: 2.0000 | INHALATION_SPRAY | Freq: Four times a day (QID) | RESPIRATORY_TRACT | 2 refills | Status: DC | PRN

## 2018-10-09 MED ORDER — GABAPENTIN 600 MG PO TABS: 600.0000 mg | ORAL_TABLET | Freq: Three times a day (TID) | ORAL | 0 refills | Status: DC

## 2018-10-09 MED ORDER — PANTOPRAZOLE SODIUM 40 MG PO TBEC: 40.0000 mg | DELAYED_RELEASE_TABLET | Freq: Every day | ORAL | 0 refills | Status: DC

## 2018-10-09 NOTE — Progress Notes (Signed)
Subjective:     Patient ID: Julian Jacobs , male    DOB: 24-May-1966 , 53 y.o.   MRN: [id number]   Chief Complaint  Patient presents with  . Thyroid Problem  . Nicotine Dependence    wants to quit-breathing is getting worse-has cut back-never tried any medications    HPI Pt is here for FU Hashimoto hypothyroid. Has multiple issues wants to address.  1- Wants helps to quit smoking. Has tried the patches for 2 weeks, but is not helping. Has tried Wellbutrin in the past, but has not helped.  2- Wants second opinion to see another neurosurgeon since the one that did his neck surgery who ordered a cervical MRI  Told him it  is negative and not related to his arm paresthesia. But if he sleeps on his R side wakes up with both hands are numb. 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  He still gets a lot of pain on his neck and only takes the Flexeril on occasion for his low back pain, but has not taken it for his neck pain. Has had to ressort to smoking marijuana sometimes since the pain gets so bad, but has not tried CBS oil. He also needs his Neurotin refilled.  3- Not happy with his psychiatrics who placed hi on sublingual med which gave him side effects. Wants a second opinion.   4- taking thyroid med qhs is helping him and not being bothered with GI pain.  5- Protonix is keeping his GERD well treated. He has been takying his PPI with thyroid med at bed time.  6- States he feels good, his diarrhea resolved, he is breathing well and has not been having any more chest pain. Taking his thyroid medication at bed time has really helped from getting nausea or abdominal pain. His energy is good. His daughter wants him to move with her to [place] but he is hesitant, since he is really pleased with his care here with me. He is still thinking about it since financially would be better for him.   Past Medical History:  Diagnosis Date  . Disc disease, degenerative, cervical   . Hyperlipidemia   .  Lumbar disc disease   . Lung nodules 2017  . Previous back surgery   . Schizophrenia (HCC) 1992  . Thyroid disease      Family History  Problem Relation Age of Onset  . Emphysema Mother   . Alcohol abuse Sister   . Kidney disease Brother      Current Outpatient Medications:  .  albuterol (PROVENTIL) (2.5 MG/3ML) 0.083% nebulizer solution, Take 3 mLs (2.5 mg total) by nebulization every 6 (six) hours as needed for wheezing or shortness of breath (to use one ampule into neb machine q 4-6h prn wheezing and cough)., Disp: 75 mL, Rfl: 12 .  budesonide-formoterol (SYMBICORT) 160-4.5 MCG/ACT inhaler, 2 puffs bid, Disp: 1 Inhaler, Rfl: 2 .  cyclobenzaprine (FLEXERIL) 10 MG tablet, Take 1 tablet (10 mg total) by mouth 3 (three) times daily as needed for muscle spasms., Disp: 30 tablet, Rfl: 0 .  ipratropium (ATROVENT) 0.02 % nebulizer solution, Take 2.5 mLs (0.5 mg total) by nebulization 3 (three) times daily., Disp: 75 mL, Rfl: 3 .  loratadine (CLARITIN) 10 MG tablet, Take 1 tablet (10 mg total) by mouth daily., Disp: 30 tablet, Rfl: 2 .  pantoprazole (PROTONIX) 40 MG tablet, Take 1 tablet (40 mg total) by mouth daily., Disp: 90 tablet, Rfl: 0 .  albuterol (PROVENTIL HFA;VENTOLIN HFA) 108 (90 Base) MCG/ACT inhaler, Inhale 2 puffs into the lungs every 6 (six) hours as needed for wheezing or shortness of breath., Disp: 1 Inhaler, Rfl: 2 .  asenapine (SAPHRIS) 5 MG SUBL 24 hr tablet, Place 1 tablet (5 mg total) under the tongue at bedtime. (Patient not taking: Reported on 10/09/2018), Disp: 30 tablet, Rfl: 0 .  gabapentin (NEURONTIN) 600 MG tablet, Take 1 tablet (600 mg total) by mouth 3 (three) times daily for 90 doses., Disp: 90 tablet, Rfl: 0 .  levothyroxine (SYNTHROID) 50 MCG tablet, Take 1 tablet (50 mcg total) by mouth daily for 30 days. 30 minutes before meals, Disp: 90 tablet, Rfl: 0 .  Tiotropium Bromide Monohydrate (SPIRIVA RESPIMAT) 2.5 MCG/ACT AERS, Inhale 2 puffs into the lungs daily.,  Disp: 1 Inhaler, Rfl: 3   Allergies  Allergen Reactions  . Penicillins Anaphylaxis    Has patient had a PCN reaction causing immediate rash, facial/tongue/throat swelling, SOB or lightheadedness with hypotension: Yes Has patient had a PCN reaction causing severe rash involving mucus membranes or skin necrosis: No Has patient had a PCN reaction that required hospitalization: No Has patient had a PCN reaction occurring within the last 10 years: No If all of the above answers are "NO", then may proceed with Cephalosporin use.      Review of Systems  Constitutional: Negative for appetite change, chills, diaphoresis, fatigue and fever.  HENT: Negative for congestion, postnasal drip and rhinorrhea.   Eyes: Negative for visual disturbance.  Respiratory: Positive for cough. Negative for shortness of breath and wheezing.   Gastrointestinal: Negative for abdominal distention, abdominal pain, diarrhea, nausea and vomiting.       No GERD  Endocrine: Negative for cold intolerance, heat intolerance, polydipsia and polyphagia.  Musculoskeletal: Positive for back pain, neck pain and neck stiffness. Negative for gait problem and joint swelling.  Skin: Negative for rash.       No dry skin or hair loss  Neurological: Positive for numbness. Negative for dizziness, weakness and headaches.       See HPI of numbness  Psychiatric/Behavioral: Negative for hallucinations, sleep disturbance and suicidal ideas. The patient is hyperactive.      Today's Vitals   10/09/18 0904  BP: 132/72  Pulse: 76  Temp: 97.7 F (36.5 C)  TempSrc: Oral  SpO2: 93%  Weight: 163 lb (73.9 kg)  Height: 5' 8.4" (1.737 m)   Body mass index is 24.5 kg/m.   Objective:  Physical Exam   Constitutional: he is oriented to person, place, and time. he appears well-developed and well-nourished. No distress.  HENT:  Head: Normocephalic and atraumatic.  Right Ear: External ear normal.  Left Ear: External ear normal.  Nose: Nose  normal.  Eyes: Conjunctivae are normal. Right eye exhibits no discharge. Left eye exhibits no discharge. No scleral icterus.  Neck: Neck supple. No thyromegaly present.  No carotid bruits bilaterally  Cardiovascular: Normal rate and regular rhythm.  No murmur heard. Pulmonary/Chest: Effort normal and breath sounds normal. No respiratory distress.  Musculoskeletal: Normal range of motion. She exhibits no edema.  Lymphadenopathy:    he has no cervical adenopathy.  Neurological: She is alert and oriented to person, place, and time.  Skin: Skin is warm and dry. Capillary refill takes less than 2 seconds. No rash noted. She is not diaphoretic.  Psychiatric: She has a normal mood and affect. His behavior is hyper, he is very frigidly. Judgment and thought content normal.  Nursing note  reviewed. Assessment And Plan:   1. Paresthesia of  Both upper extremities- chronic. I will send him to neurologist and they can do an EMG.  - Ambulatory referral to Neurology  2. Muscle spasms of neck- secondary to cervical disc disease.  - cyclobenzaprine (FLEXERIL) 10 MG tablet; Take 1 tablet (10 mg total) by mouth 3 (three) times daily as needed for muscle spasms.  Dispense: 90 tablet; Refill: 0 - gabapentin (NEURONTIN) 600 MG tablet; Take 1 tablet (600 mg total) by mouth 3 (three) times daily for 90 doses.  Dispense: 90 tablet; Refill: 2 I did recommend he tries CBD oil for his chronic pain.  3. Gastroesophageal reflux disease without esophagitis- stable on current med. Was asked to switch his Protonix to am 30 min before meals.  - pantoprazole (PROTONIX) 40 MG tablet; Take 1 tablet (40 mg total) by mouth daily.  Dispense: 90 tablet; Refill: 0  4. Pure hypercholesterolemia- chronic. Has been on low fat diet. If not improved he agrees to try medication.  - Lipid Profile  5. Hypothyroidism, unspecified type- chronic, symptoms improved. If labs normal, I will send his refill.  - TSH - T3, free - T4, Free -  CMP14 + Anion Gap  6. Smoker- chronic. I will have him try Chantix. Was explained he can use this with nicotine patch.  7- Schizophrenia- chronic. Not responding to medication. [facility]  - CMP14 + Anion Gap   PT requested to be seen every month, because he over worries about his health if has has to come back every 3 months, and saves a list of problems to review til seen. I explained to him, he may come see me sooner if new things arrive and I should be able to see him within a week. He still insisted he would have pease of mind to come monthly. I agreed with him.   Vaishali Baise RODRIGUEZ-SOUTHWORTH, PA-C

## 2018-10-10 ENCOUNTER — Other Ambulatory Visit: Payer: Self-pay | Admitting: Internal Medicine

## 2018-10-10 DIAGNOSIS — E039 Hypothyroidism, unspecified: Secondary | ICD-10-CM

## 2018-10-10 LAB — CMP14 + ANION GAP
ALT: 12 IU/L (ref 0–44)
AST: 20 IU/L (ref 0–40)
Albumin/Globulin Ratio: 1.8 (ref 1.2–2.2)
Albumin: 4 g/dL (ref 3.8–4.9)
Alkaline Phosphatase: 80 IU/L (ref 39–117)
Anion Gap: 15 mmol/L (ref 10.0–18.0)
BILIRUBIN TOTAL: 0.5 mg/dL (ref 0.0–1.2)
BUN/Creatinine Ratio: 9 (ref 9–20)
BUN: 9 mg/dL (ref 6–24)
CHLORIDE: 101 mmol/L (ref 96–106)
CO2: 24 mmol/L (ref 20–29)
Calcium: 9.5 mg/dL (ref 8.7–10.2)
Creatinine, Ser: 0.97 mg/dL (ref 0.76–1.27)
GFR calc Af Amer: 102 mL/min/{1.73_m2} (ref >59)
GFR calc non Af Amer: 88 mL/min/{1.73_m2} (ref >59)
Globulin, Total: 2.2 g/dL (ref 1.5–4.5)
Glucose: 73 mg/dL (ref 65–99)
POTASSIUM: 4.7 mmol/L (ref 3.5–5.2)
Sodium: 140 mmol/L (ref 134–144)
TOTAL PROTEIN: 6.2 g/dL (ref 6.0–8.5)

## 2018-10-10 LAB — T3, FREE: T3, Free: 3.2 pg/mL (ref 2.0–4.4)

## 2018-10-10 LAB — LIPID PANEL
Chol/HDL Ratio: 4.3 ratio (ref 0.0–5.0)
Cholesterol, Total: 195 mg/dL (ref 100–199)
HDL: 45 mg/dL (ref >39)
LDL CALC: 132 mg/dL — AB (ref 0–99)
Triglycerides: 88 mg/dL (ref 0–149)
VLDL Cholesterol Cal: 18 mg/dL (ref 5–40)

## 2018-10-10 LAB — T4, FREE: Free T4: 1.34 ng/dL (ref 0.82–1.77)

## 2018-10-10 LAB — TSH: TSH: 2.19 u[IU]/mL (ref 0.450–4.500)

## 2018-10-10 MED ORDER — LEVOTHYROXINE SODIUM 50 MCG PO TABS: 50.0000 ug | ORAL_TABLET | Freq: Every day | ORAL | 3 refills | Status: DC

## 2018-10-18 ENCOUNTER — Other Ambulatory Visit: Payer: Self-pay

## 2018-10-18 ENCOUNTER — Ambulatory Visit (INDEPENDENT_AMBULATORY_CARE_PROVIDER_SITE_OTHER): Payer: Medicare HMO | Admitting: Internal Medicine

## 2018-10-18 ENCOUNTER — Encounter: Payer: Self-pay | Admitting: Internal Medicine

## 2018-10-18 VITALS — BP 110/70

## 2018-10-18 DIAGNOSIS — M545 Low back pain, unspecified: Secondary | ICD-10-CM

## 2018-10-18 DIAGNOSIS — M549 Dorsalgia, unspecified: Secondary | ICD-10-CM

## 2018-10-18 MED ORDER — KETOROLAC TROMETHAMINE 60 MG/2ML IM SOLN
60.0000 mg | Freq: Once | INTRAMUSCULAR | Status: AC
  Administered 2018-10-18: 60 mg via INTRAMUSCULAR

## 2018-10-18 NOTE — Progress Notes (Signed)
Subjective:     Patient ID: Julian Jacobs , male    DOB: [birth year] , 53 y.o.   MRN: [id number]   Chief Complaint  Patient presents with  . Back Pain    10/10/18- always had pain problems that come and go but this wont leave    HPI Pt is here due to having worse back pain from injuring himself on 2/19 when  he bent over to pick up something. Since then he is getting worse. This am could not get out of bed. Feels worse than when he needed back surgery. Has used ice and heat and flexeril is not helping, cant lay on sides or back due to pain. Is sleeping poorly since this has been going on. Pain is radiating to is groin and testicles. Denies saddle paresthesia or leg weakness. Pain is better to stand still, worse to walk or straighten back from sitting and trying to take a step.   Past Medical History:  Diagnosis Date  . Disc disease, degenerative, cervical   . Hyperlipidemia   . Lumbar disc disease   . Lung nodules 2017  . Previous back surgery   . Schizophrenia (HCC) 1992  . Thyroid disease      Family History  Problem Relation Age of Onset  . Emphysema Mother   . Alcohol abuse Sister   . Kidney disease Brother      Current Outpatient Medications:  .  albuterol (PROVENTIL HFA;VENTOLIN HFA) 108 (90 Base) MCG/ACT inhaler, Inhale 2 puffs into the lungs every 6 (six) hours as needed for wheezing or shortness of breath., Disp: 1 Inhaler, Rfl: 2 .  albuterol (PROVENTIL) (2.5 MG/3ML) 0.083% nebulizer solution, Take 3 mLs (2.5 mg total) by nebulization every 6 (six) hours as needed for wheezing or shortness of breath (to use one ampule into neb machine q 4-6h prn wheezing and cough)., Disp: 75 mL, Rfl: 12 .  asenapine (SAPHRIS) 5 MG SUBL 24 hr tablet, Place 1 tablet (5 mg total) under the tongue at bedtime. (Patient not taking: Reported on 10/09/2018), Disp: 30 tablet, Rfl: 0 .  budesonide-formoterol (SYMBICORT) 160-4.5 MCG/ACT inhaler, 2 puffs bid, Disp: 1 Inhaler, Rfl: 2 .   cyclobenzaprine (FLEXERIL) 10 MG tablet, Take 1 tablet (10 mg total) by mouth 3 (three) times daily as needed for muscle spasms., Disp: 90 tablet, Rfl: 0 .  gabapentin (NEURONTIN) 600 MG tablet, Take 1 tablet (600 mg total) by mouth 3 (three) times daily for 90 doses., Disp: 90 tablet, Rfl: 2 .  ipratropium (ATROVENT) 0.02 % nebulizer solution, Take 2.5 mLs (0.5 mg total) by nebulization 3 (three) times daily., Disp: 75 mL, Rfl: 3 .  levothyroxine (SYNTHROID) 50 MCG tablet, Take 1 tablet (50 mcg total) by mouth daily for 30 days. 30 minutes before meals, Disp: 90 tablet, Rfl: 3 .  loratadine (CLARITIN) 10 MG tablet, Take 1 tablet (10 mg total) by mouth daily., Disp: 30 tablet, Rfl: 2 .  pantoprazole (PROTONIX) 40 MG tablet, Take 1 tablet (40 mg total) by mouth daily., Disp: 90 tablet, Rfl: 0 .  Tiotropium Bromide Monohydrate (SPIRIVA RESPIMAT) 2.5 MCG/ACT AERS, Inhale 2 puffs into the lungs daily., Disp: 1 Inhaler, Rfl: 3 .  varenicline (CHANTIX STARTING MONTH PAK) 0.5 MG X 11 & 1 MG X 42 tablet, Take one 0.5 mg tablet by mouth once daily for 3 days, then increase to one 0.5 mg tablet twice daily for 4 days, then increase to one 1 mg tablet twice daily., Disp: 53 tablet,  Rfl: 0   Allergies  Allergen Reactions  . Penicillins Anaphylaxis    Has patient had a PCN reaction causing immediate rash, facial/tongue/throat swelling, SOB or lightheadedness with hypotension: Yes Has patient had a PCN reaction causing severe rash involving mucus membranes or skin necrosis: No Has patient had a PCN reaction that required hospitalization: No Has patient had a PCN reaction occurring within the last 10 years: No If all of the above answers are "NO", then may proceed with Cephalosporin use.      Review of Systems  Gastrointestinal: Positive for abdominal pain.  Genitourinary: Positive for testicular pain. Negative for dysuria, flank pain, frequency, hematuria and urgency.       Denies urinary or stool  incotinence.   Skin: Negative for rash.  Neurological: Negative for weakness and numbness.     Today's Vitals   10/18/18 1517  BP: 110/70   There is no height or weight on file to calculate BMI.   Objective:  Physical Exam Vitals signs reviewed.  Constitutional:      General: He is in acute distress.     Appearance: Normal appearance.     Comments: He is in moderate pain  HENT:     Head: Normocephalic.     Right Ear: External ear normal.     Left Ear: External ear normal.     Nose: Nose normal.  Eyes:     General: No scleral icterus.    Conjunctiva/sclera: Conjunctivae normal.  Neck:     Musculoskeletal: Neck supple.  Pulmonary:     Effort: Pulmonary effort is normal.  Musculoskeletal: Normal range of motion.     Comments: BACK- has a hard time straightening his back due to pain, has normal lateral ROM, but hurts him more with flexion to his R. Anterior flexion is only 20 degrees due to pain. Has neg SLR. Testing strength of lower legs provoked pain on his back.   Skin:    General: Skin is warm and dry.     Findings: No rash.  Neurological:     Mental Status: He is alert and oriented to person, place, and time.     Motor: No weakness.     Gait: Gait normal.     Deep Tendon Reflexes: Reflexes normal.  Psychiatric:        Mood and Affect: Mood normal.        Behavior: Behavior normal.        Thought Content: Thought content normal.        Judgment: Judgment normal.     Assessment And Plan:     1. Acute midline back pain, unspecified back location- acute. He plans to go se ortho at a walk in clinic after 5 pm today in the hopes he can get an MRI.   - ketorolac (TORADOL) injection 60 mg   Kasee Hantz RODRIGUEZ-SOUTHWORTH, PA-C

## 2018-10-19 ENCOUNTER — Encounter: Payer: Self-pay | Admitting: Neurology

## 2018-10-23 DIAGNOSIS — J441 Chronic obstructive pulmonary disease with (acute) exacerbation: Secondary | ICD-10-CM | POA: Diagnosis not present

## 2018-11-06 ENCOUNTER — Ambulatory Visit: Payer: Medicare HMO | Admitting: Internal Medicine

## 2018-11-15 DIAGNOSIS — S39012A Strain of muscle, fascia and tendon of lower back, initial encounter: Secondary | ICD-10-CM | POA: Diagnosis not present

## 2018-11-15 DIAGNOSIS — M545 Low back pain: Secondary | ICD-10-CM | POA: Diagnosis not present

## 2018-11-15 DIAGNOSIS — F1290 Cannabis use, unspecified, uncomplicated: Secondary | ICD-10-CM | POA: Diagnosis not present

## 2018-11-15 DIAGNOSIS — M5136 Other intervertebral disc degeneration, lumbar region: Secondary | ICD-10-CM | POA: Diagnosis not present

## 2018-11-15 DIAGNOSIS — F17200 Nicotine dependence, unspecified, uncomplicated: Secondary | ICD-10-CM | POA: Diagnosis not present

## 2018-11-15 DIAGNOSIS — E039 Hypothyroidism, unspecified: Secondary | ICD-10-CM | POA: Diagnosis not present

## 2018-11-15 DIAGNOSIS — S39013A Strain of muscle, fascia and tendon of pelvis, initial encounter: Secondary | ICD-10-CM | POA: Diagnosis not present

## 2018-11-23 DIAGNOSIS — J441 Chronic obstructive pulmonary disease with (acute) exacerbation: Secondary | ICD-10-CM | POA: Diagnosis not present

## 2018-11-30 DIAGNOSIS — E039 Hypothyroidism, unspecified: Secondary | ICD-10-CM | POA: Diagnosis not present

## 2018-11-30 DIAGNOSIS — R05 Cough: Secondary | ICD-10-CM | POA: Diagnosis not present

## 2018-11-30 DIAGNOSIS — R45851 Suicidal ideations: Secondary | ICD-10-CM | POA: Diagnosis not present

## 2018-11-30 DIAGNOSIS — R0981 Nasal congestion: Secondary | ICD-10-CM | POA: Diagnosis not present

## 2018-11-30 DIAGNOSIS — J439 Emphysema, unspecified: Secondary | ICD-10-CM | POA: Diagnosis not present

## 2018-11-30 DIAGNOSIS — Z590 Homelessness: Secondary | ICD-10-CM | POA: Diagnosis not present

## 2018-11-30 DIAGNOSIS — F209 Schizophrenia, unspecified: Secondary | ICD-10-CM | POA: Diagnosis not present

## 2018-11-30 DIAGNOSIS — J45909 Unspecified asthma, uncomplicated: Secondary | ICD-10-CM | POA: Diagnosis not present

## 2018-11-30 DIAGNOSIS — F17210 Nicotine dependence, cigarettes, uncomplicated: Secondary | ICD-10-CM | POA: Diagnosis not present

## 2018-12-06 ENCOUNTER — Ambulatory Visit: Payer: Self-pay | Admitting: Pulmonary Disease

## 2018-12-12 ENCOUNTER — Encounter: Payer: Self-pay | Admitting: Internal Medicine

## 2018-12-17 ENCOUNTER — Encounter: Payer: Self-pay | Admitting: Neurology

## 2018-12-23 DIAGNOSIS — J441 Chronic obstructive pulmonary disease with (acute) exacerbation: Secondary | ICD-10-CM | POA: Diagnosis not present

## 2018-12-28 ENCOUNTER — Other Ambulatory Visit: Payer: Self-pay

## 2018-12-28 DIAGNOSIS — M62838 Other muscle spasm: Secondary | ICD-10-CM

## 2018-12-28 MED ORDER — CYCLOBENZAPRINE HCL 10 MG PO TABS: 10.0000 mg | ORAL_TABLET | Freq: Three times a day (TID) | ORAL | 0 refills | Status: AC | PRN

## 2018-12-28 MED ORDER — GABAPENTIN 600 MG PO TABS: 600.0000 mg | ORAL_TABLET | Freq: Three times a day (TID) | ORAL | 2 refills | Status: AC

## 2018-12-31 ENCOUNTER — Encounter

## 2018-12-31 ENCOUNTER — Ambulatory Visit: Payer: Self-pay | Admitting: Neurology

## 2019-01-18 ENCOUNTER — Other Ambulatory Visit: Payer: Self-pay | Admitting: Internal Medicine

## 2019-01-18 DIAGNOSIS — K219 Gastro-esophageal reflux disease without esophagitis: Secondary | ICD-10-CM

## 2019-01-18 DIAGNOSIS — E039 Hypothyroidism, unspecified: Secondary | ICD-10-CM

## 2019-01-21 ENCOUNTER — Other Ambulatory Visit: Payer: Self-pay

## 2019-01-21 MED ORDER — LEVOTHYROXINE SODIUM 50 MCG PO TABS: 50.0000 ug | ORAL_TABLET | Freq: Every day | ORAL | 3 refills | Status: DC

## 2019-01-21 MED ORDER — PANTOPRAZOLE SODIUM 40 MG PO TBEC: 40.0000 mg | DELAYED_RELEASE_TABLET | Freq: Every day | ORAL | 0 refills | Status: DC

## 2019-01-23 DIAGNOSIS — J441 Chronic obstructive pulmonary disease with (acute) exacerbation: Secondary | ICD-10-CM | POA: Diagnosis not present

## 2019-02-11 ENCOUNTER — Inpatient Hospital Stay: Admission: RE | Admit: 2019-02-11 | Payer: Medicare HMO | Source: Ambulatory Visit

## 2019-02-22 DIAGNOSIS — J441 Chronic obstructive pulmonary disease with (acute) exacerbation: Secondary | ICD-10-CM | POA: Diagnosis not present

## 2019-02-26 ENCOUNTER — Other Ambulatory Visit: Payer: Self-pay

## 2019-02-26 DIAGNOSIS — J439 Emphysema, unspecified: Secondary | ICD-10-CM

## 2019-02-26 MED ORDER — ALBUTEROL SULFATE HFA 108 (90 BASE) MCG/ACT IN AERS: 2.0000 | INHALATION_SPRAY | Freq: Four times a day (QID) | RESPIRATORY_TRACT | 3 refills | Status: AC | PRN

## 2019-03-25 DIAGNOSIS — J441 Chronic obstructive pulmonary disease with (acute) exacerbation: Secondary | ICD-10-CM | POA: Diagnosis not present

## 2019-04-13 ENCOUNTER — Other Ambulatory Visit: Payer: Self-pay | Admitting: Internal Medicine

## 2019-04-13 DIAGNOSIS — K219 Gastro-esophageal reflux disease without esophagitis: Secondary | ICD-10-CM

## 2019-04-13 DIAGNOSIS — E039 Hypothyroidism, unspecified: Secondary | ICD-10-CM

## 2019-04-13 DIAGNOSIS — M62838 Other muscle spasm: Secondary | ICD-10-CM

## 2019-04-15 MED ORDER — LEVOTHYROXINE SODIUM 50 MCG PO TABS: 50.0000 ug | ORAL_TABLET | Freq: Every day | ORAL | 0 refills | Status: AC

## 2019-04-15 MED ORDER — PANTOPRAZOLE SODIUM 40 MG PO TBEC: 40.0000 mg | DELAYED_RELEASE_TABLET | Freq: Every day | ORAL | 0 refills | Status: AC

## 2019-04-15 NOTE — Telephone Encounter (Signed)
Can you please refill patient prescription since Julian Jacobs is not here. He has an appointment to see her in October.

## 2019-04-25 DIAGNOSIS — J441 Chronic obstructive pulmonary disease with (acute) exacerbation: Secondary | ICD-10-CM | POA: Diagnosis not present

## 2019-06-04 ENCOUNTER — Encounter: Payer: Medicare HMO | Admitting: Internal Medicine

## 2019-06-06 ENCOUNTER — Encounter: Payer: Medicare HMO | Admitting: Internal Medicine

## 2019-07-17 ENCOUNTER — Ambulatory Visit: Payer: Medicare HMO | Admitting: Internal Medicine

## 2019-07-17 ENCOUNTER — Ambulatory Visit: Payer: Medicare HMO

## 2019-07-25 ENCOUNTER — Ambulatory Visit: Payer: Medicare HMO

## 2019-07-25 ENCOUNTER — Ambulatory Visit: Payer: Medicare HMO | Admitting: Internal Medicine

## 2019-08-07 ENCOUNTER — Other Ambulatory Visit: Payer: Self-pay | Admitting: Internal Medicine

## 2019-08-07 DIAGNOSIS — K219 Gastro-esophageal reflux disease without esophagitis: Secondary | ICD-10-CM

## 2019-10-01 DIAGNOSIS — Z20828 Contact with and (suspected) exposure to other viral communicable diseases: Secondary | ICD-10-CM | POA: Diagnosis not present

## 2019-10-18 IMAGING — MR MR CERVICAL SPINE W/O CM
5 series · 30 of 48 positions shown · non-contrast
Comparison: CT cervical spine 07/27/2018. MRI cervical spine
09/28/2016

CLINICAL DATA: Cervical spondylosis with radiculopathy.

EXAM:
MRI CERVICAL SPINE WITHOUT CONTRAST
TECHNIQUE: Multiplanar, multisequence MR imaging of the cervical spine was
performed. No intravenous contrast was administered.

[Series 2: T2 · sagittal · 3.0mm · 0.41mm/px · 6 of 14 slices shown (1 of 2)]
[im 1/14]
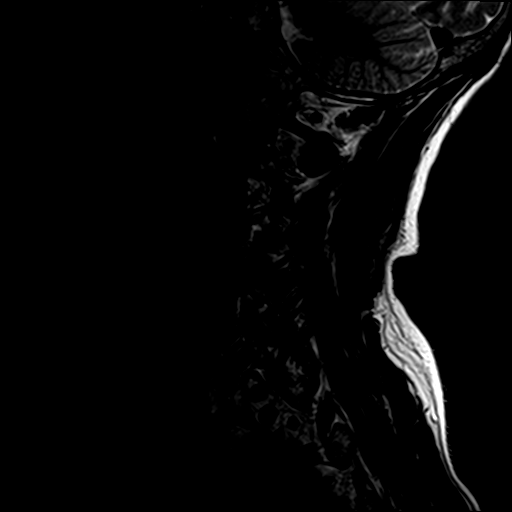
[im 3/14]
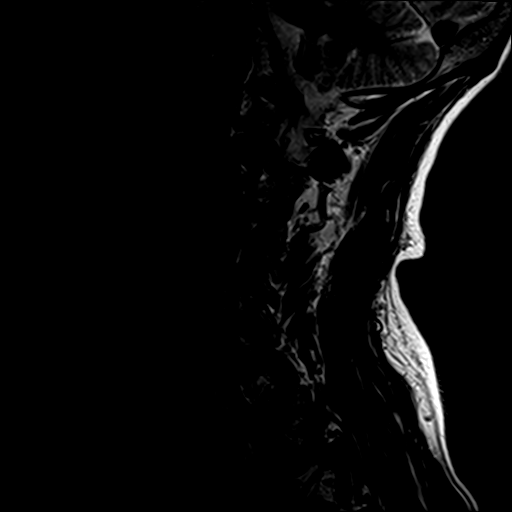
[im 6/14]
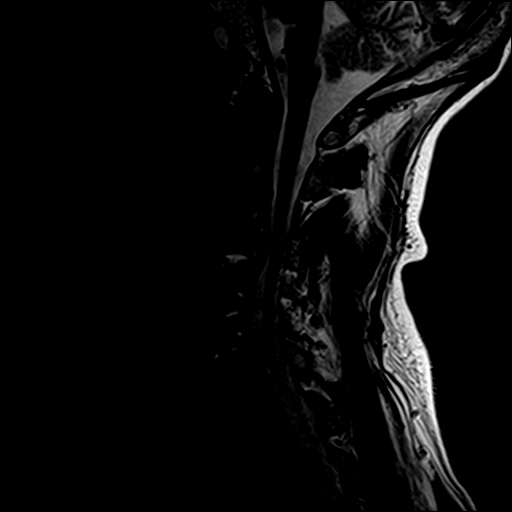
[im 8/14]
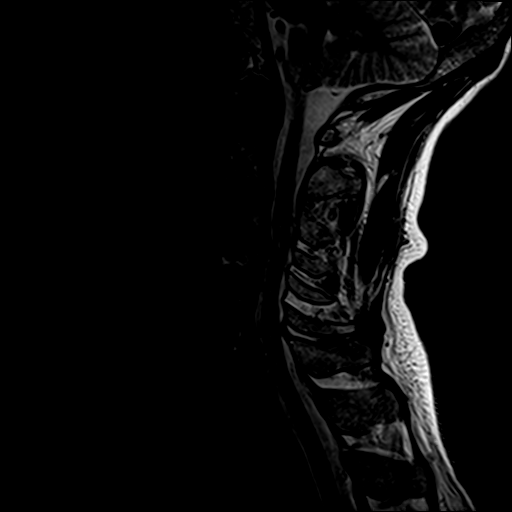
[im 11/14]
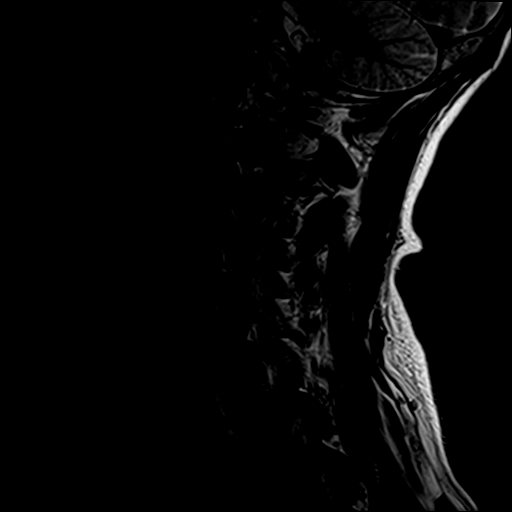
[im 14/14]
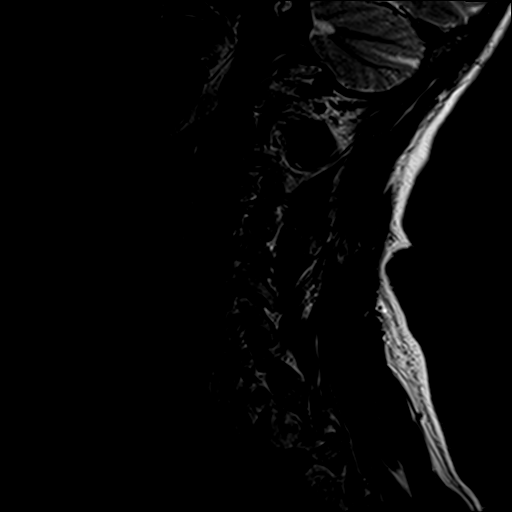

[Series 3: T1 · sagittal · 3.0mm · 0.41mm/px · 7 of 14 slices shown]
[im 1/14]
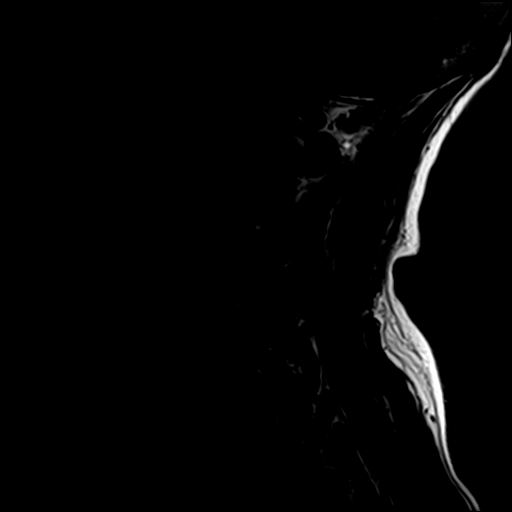
[im 3/14]
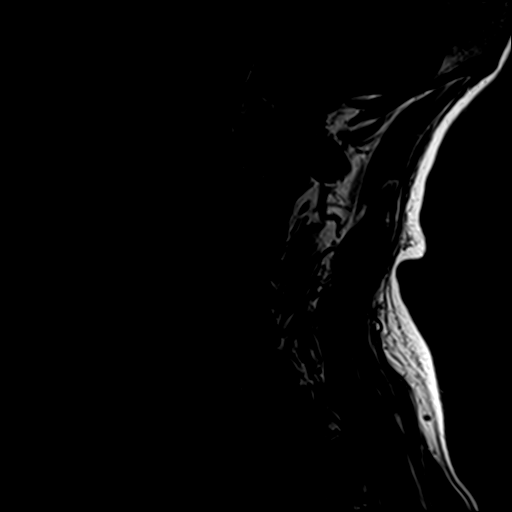
[im 5/14]
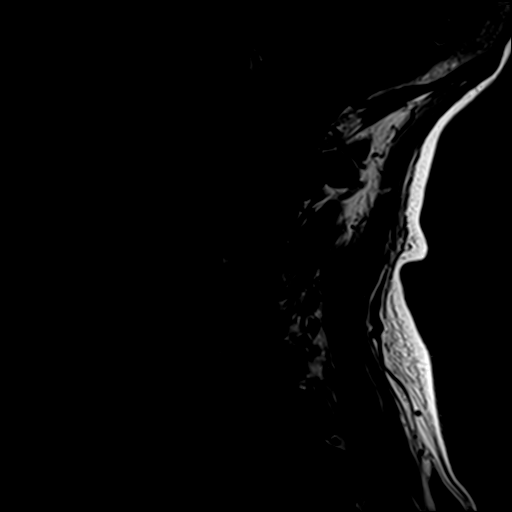
[im 7/14]
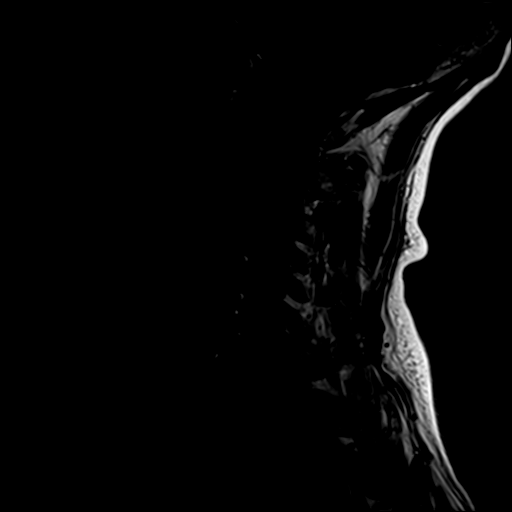
[im 9/14]
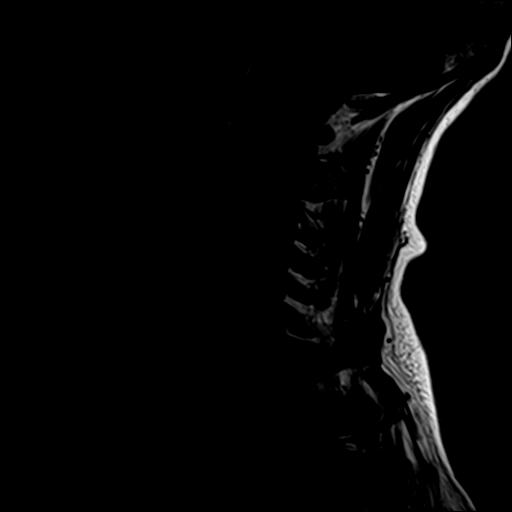
[im 11/14]
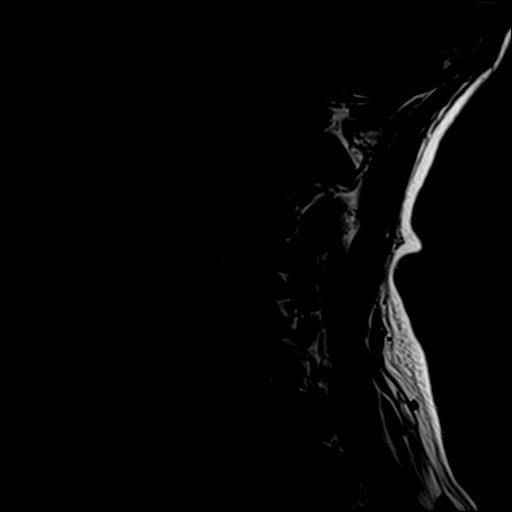
[im 14/14]
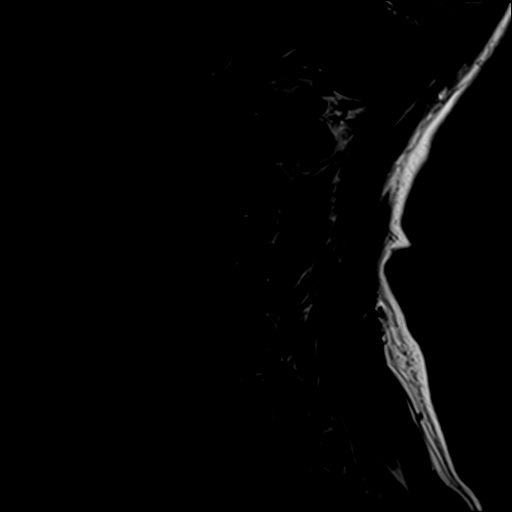

[Series 4: STIR · sagittal · 3.0mm · 1.09mm/px · 7 of 14 slices shown]
[im 1/14]
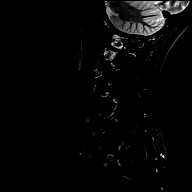
[im 3/14]
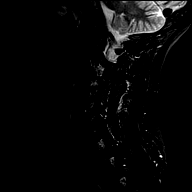
[im 5/14]
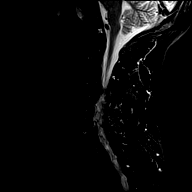
[im 7/14]
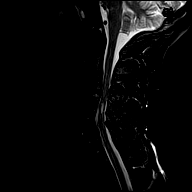
[im 9/14]
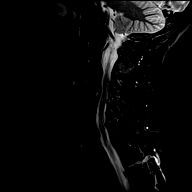
[im 11/14]
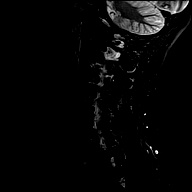
[im 14/14]
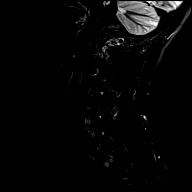

[Series 5: GRE · axial · 3.0mm · 0.35mm/px · z∈[-56,-41]mm · 2 of 28 slices shown]
[im 1/28]
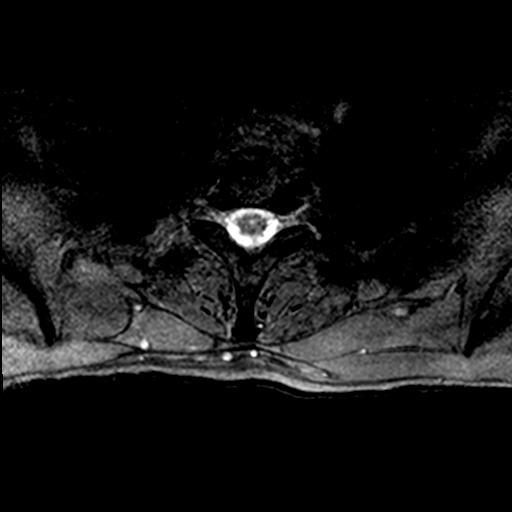
[im 5/28]
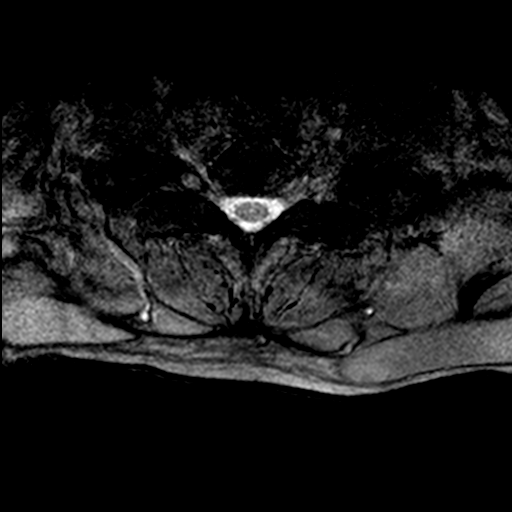

[Series 6: T2 · axial · 3.0mm · 0.70mm/px · z∈[-56,+43]mm · 8 of 28 slices shown (2 of 2)]
[im 1/28]
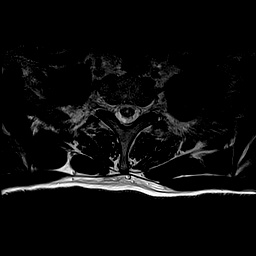
[im 5/28]
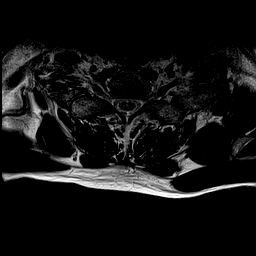
[im 9/28]
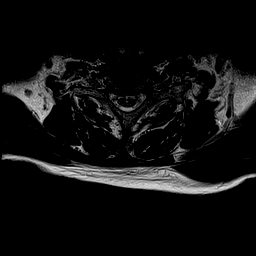
[im 13/28]
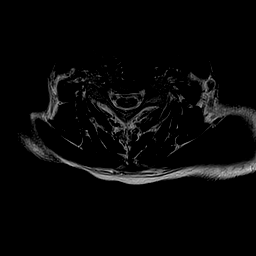
[im 15/28]
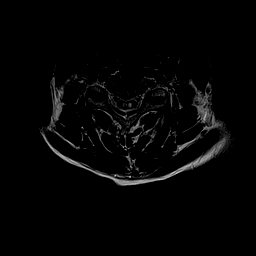
[im 19/28]
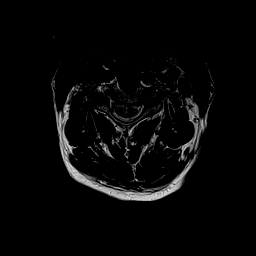
[im 23/28]
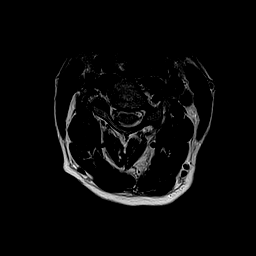
[im 28/28]
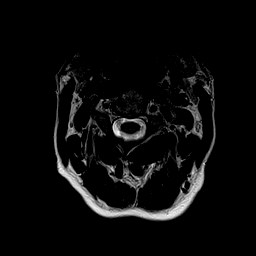

[30 of 48 positions shown; findings below may reference images not displayed]

FINDINGS: Alignment: Mild retrolisthesis C3-4.  Remaining alignment normal

Vertebrae: ACDF with anterior plate and screws at C4-5 and C5-6.
Negative for fracture or mass

Cord: Cord hyperintensity at the C4-5 level, left greater than right
is unchanged. Prominent central canal on the upper thoracic and
lower cervical spinal cord unchanged. This measures 1 mm in
diameter.

Posterior Fossa, vertebral arteries, paraspinal tissues: Negative

Disc levels:

C2-3: Mild uncinate spurring on the right with mild right foraminal
narrowing

C3-4: Asymmetric disc degeneration on the left. Diffuse uncinate
spurring. Severe left foraminal encroachment and moderate right
foraminal encroachment. Mild spinal stenosis. Small central disc
protrusion unchanged.

C4-5: ACDF.  Negative for stenosis.

C5-6: ACDF. Negative for stenosis. Mild facet degeneration
bilaterally

C6-7: Disc degeneration and spondylosis. Diffuse uncinate spurring
with mild spinal stenosis and mild foraminal stenosis bilaterally

C7-T1: Negative
IMPRESSION: Disc degeneration and spondylosis at C3-4. Severe left foraminal
encroachment. Moderate right foraminal encroachment similar to the
prior study. Small central disc protrusion with mild spinal stenosis

ACDF C4-5 and C5-6 without stenosis

Disc degeneration and spondylosis C6-7 with mild spinal stenosis and
mild foraminal stenosis bilaterally

No change from the prior MRI 09/28/2016.
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
